# Patient Record
Sex: Male | Born: 1960 | Race: White | Hispanic: No | Marital: Single | Smoking: Current every day smoker
Health system: Southern US, Community
[De-identification: ages and names within clinical notes are randomized; demographics above are authoritative.]

## PROBLEM LIST (undated history)

## (undated) ENCOUNTER — Emergency Department (HOSPITAL_COMMUNITY): Admission: EM | Payer: Self-pay | Source: Home / Self Care

## (undated) DIAGNOSIS — I10 Essential (primary) hypertension: Secondary | ICD-10-CM

## (undated) DIAGNOSIS — F209 Schizophrenia, unspecified: Secondary | ICD-10-CM

## (undated) DIAGNOSIS — F319 Bipolar disorder, unspecified: Secondary | ICD-10-CM

## (undated) DIAGNOSIS — I2119 ST elevation (STEMI) myocardial infarction involving other coronary artery of inferior wall: Secondary | ICD-10-CM

## (undated) DIAGNOSIS — I2510 Atherosclerotic heart disease of native coronary artery without angina pectoris: Secondary | ICD-10-CM

## (undated) DIAGNOSIS — E785 Hyperlipidemia, unspecified: Secondary | ICD-10-CM

## (undated) HISTORY — DX: Schizophrenia, unspecified: F20.9

## (undated) HISTORY — DX: Essential (primary) hypertension: I10

## (undated) HISTORY — DX: ST elevation (STEMI) myocardial infarction involving other coronary artery of inferior wall: I21.19

## (undated) HISTORY — DX: Atherosclerotic heart disease of native coronary artery without angina pectoris: I25.10

## (undated) HISTORY — DX: Hyperlipidemia, unspecified: E78.5

## (undated) HISTORY — PX: COLONOSCOPY: SHX174

## (undated) HISTORY — PX: NASAL SINUS SURGERY: SHX719

---

## 2007-08-25 ENCOUNTER — Ambulatory Visit: Payer: Self-pay | Admitting: Cardiovascular Disease

## 2007-08-25 ENCOUNTER — Inpatient Hospital Stay (HOSPITAL_COMMUNITY): Admission: AD | Admit: 2007-08-25 | Discharge: 2007-08-27 | Payer: Self-pay | Admitting: Cardiology

## 2007-08-26 ENCOUNTER — Encounter: Payer: Self-pay | Admitting: Cardiovascular Disease

## 2007-10-20 ENCOUNTER — Ambulatory Visit: Payer: Self-pay | Admitting: Cardiology

## 2007-10-30 ENCOUNTER — Ambulatory Visit: Payer: Self-pay | Admitting: Cardiology

## 2008-01-29 ENCOUNTER — Ambulatory Visit: Payer: Self-pay | Admitting: Cardiology

## 2008-01-29 DIAGNOSIS — E785 Hyperlipidemia, unspecified: Secondary | ICD-10-CM | POA: Insufficient documentation

## 2008-01-29 DIAGNOSIS — I2510 Atherosclerotic heart disease of native coronary artery without angina pectoris: Secondary | ICD-10-CM | POA: Insufficient documentation

## 2010-05-16 ENCOUNTER — Emergency Department (HOSPITAL_COMMUNITY): Admission: EM | Admit: 2010-05-16 | Payer: Self-pay | Source: Home / Self Care

## 2010-05-24 NOTE — H&P (Signed)
NAMESANG, BLOUNT                ACCOUNT NO.:  [id number]   MEDICAL RECORD NO.:  [id number]          PATIENT TYPE:  INP   LOCATION:  2914                         FACILITY:  [facility]   PHYSICIAN:  Veverly Fells. Excell Seltzer, MD  DATE OF BIRTH:  [birth year]   DATE OF ADMISSION:  08/25/2007  DATE OF DISCHARGE:                              HISTORY & PHYSICAL   ADDENDUM   Concerning patient [name], work number [phone].   I dictated to admit the patient to Dr. Donato Schultz, that is actually  incorrect.  The patient would be admitted to Dr. Tonny Bollman with  [facility], and so the H&P will need to be routed to him for co-  signature.      Christell Faith, MD   Electronically Signed     ______________________________  Veverly Fells. Excell Seltzer, MD    NDL/MEDQ  D:  08/25/2007  T:  08/26/2007  Job:  [id number]

## 2010-05-24 NOTE — Assessment & Plan Note (Signed)
[facility]ALTHCARE                       [place] CARDIOLOGY OFFICE NOTE   KAYD, LAUNER                       MRN:          [id number]  DATE:01/29/2008                            DOB:          July 21, 1960    PRIMARY CARE PHYSICIAN:  Dierdre Forth, NP, with [facility] Department.   REASON FOR VISIT:  Cardiac followup.   HISTORY OF PRESENT ILLNESS:  Mr. Cragle comes in for a 5-month visit.  He states that he has done very well without any significant angina or  progressive shortness of breath.  He reports compliance with his  medications which he is receiving with assistance from the Health  Department.  He has continued Plavix and states that he has a year's  supply.  His blood pressure and heart rate look fairly good today.  He  is due for followup liver function and lipids, and is tolerating high-  dose Zocor at this time.   ALLERGIES:  No known drug allergies.   PRESENT MEDICATIONS:  1. Aspirin 81 mg p.o. daily.  2. Coreg 12.5 mg p.o. b.i.d.  3. Zocor 80 mg p.o. nightly.  4. Plavix 75 mg p.o. daily.  5. Accupril 10 mg p.o. daily.  6. Imdur 60 mg p.o. daily.  7. Thiothixene 5 mg p.o. nightly.  8. Citalopram 20 mg p.o. q.a.m.  9. Trazodone 50 mg p.o. nightly.  10.Benztropine mesylate 1 mg p.o. nightly.  11.Sublingual nitroglycerin 0.4 mg p.r.n.  12.Hydrocodone p.r.n.   REVIEW OF SYSTEMS:  As described in the history of present illness.  He  does state that he has cut back his cigarettes, but is still smoking one  pack per day.  We talked about smoking cessation today.  He is not  reporting any claudication symptoms.  He is no longer complaining of  cough.  Otherwise negative.   PHYSICAL EXAMINATION:  VITAL SIGNS:  Blood pressure is 127/80, heart  rate is 59 and regular, weight is 185 pounds.  GENERAL:  The patient is comfortable, no acute distress, somewhat  disheveled.  HEENT:  Conjunctiva is normal.  Oropharynx is  clear.  NECK:  Supple.  No elevated jugular venous pressure.  No loud bruits.  No thyromegaly.  LUNGS:  Clear with diminished breath sounds somewhat coarse.  No  wheezing.  CARDIAC:  Regular rate and rhythm.  No pericardial rub or S3 gallop.  ABDOMEN:  Soft, nontender.  Normoactive bowel sounds.  EXTREMITIES:  No significant pitting edema.  Distal pulses are 1 to 2+.  SKIN:  Warm and dry.  MUSCULOSKELETAL:  No kyphosis noted.  NEUROPSYCHIATRIC:  The patient is alert and oriented x3.  Affect is  appropriate.   IMPRESSION AND RECOMMENDATIONS:  Coronary artery disease status post  inferoposterior myocardial infarction in August 2009 related to an  occluded dominant circumflex vessel which was treated with bare-metal  stenting.  He has moderate residual left anterior descending and  diagonal disease.  This is being managed medically and he is not  reporting any angina on medical therapy.  I will plan to continue dual-  antiplatelet therapy at this point.  He is due for followup lipid  profile and liver function tests on high-dose Zocor.  We will aim for  LDL control around 70.  Otherwise, I will plan to see him back over the  next 6 months for reevaluation.      Jonelle Sidle, MD  Electronically Signed     Jonelle Sidle, MD  Electronically Signed   SGM/MedQ  DD: 01/29/2008  DT: 01/30/2008  Job #: [id number]   cc:   Dierdre Forth, NP

## 2010-05-24 NOTE — Discharge Summary (Signed)
[name], [name]                ACCOUNT NO.:  [id number]   MEDICAL RECORD NO.:  [id number]          PATIENT TYPE:  INP   LOCATION:  2927                         FACILITY:  [facility]   PHYSICIAN:  Marca Ancona, MD      DATE OF BIRTH:  August 27, 1960   DATE OF ADMISSION:  08/25/2007  DATE OF DISCHARGE:  08/27/2007                         DISCHARGE SUMMARY - REFERRING   Patient does not have primary care physician.   DISCHARGE DIAGNOSES:  1. Inferior ST elevated myocardial infarction.  2. Coronary artery disease.  3. Status post bare metal stent to the right coronary artery.  4. Hyperlipidemia.  5. Hypertension.  6. Hyperglycemia, however, probably related to #1 as hemoglobin A1c is      5.5.  7. Tobacco use.  8. Cocaine, marijuana, and alcohol use.  9. Elevated liver function tests, probable combination of myocardial      infarction and alcohol abuse.   SUMMARY OF HISTORY:  [name] is a 50 year old white male without  known cardiac history.  He experienced crushing substernal chest  discomfort while in a heated argument with his son.  On presentation,  EKG showed inferoposterior  ST elevated myocardial infarction.  He was  taken emergently to the catheterization lab.   HISTORY:  Notable for:  1. Tobacco use.  2. Alcohol use.  3. Cocaine use.  4. Marijuana use.  5. Hypertension.  6. History of bipolar schizophrenia.  7. Poor dentition.  8. Untreated inguinal hernia.   LABORATORY DATA:  Admission weight was 84 kg.  On August 26, 2007, H&H  was 14.9 and 44.4, normal indices, platelets 314, WBCs 15.0, sodium 138,  potassium 3.8, BUN 5, creatinine 1.06, glucose 162.  LFTs were elevated,  his AST and ALT were 491 and 127 respectively on August 27, 2007.  Prior  to discharge, they had decreased to 259 and 133.  Hemoglobin A1c was  5.5.  Initial CK-MB was 6196 and 356.1 with a relative index 5.7.  Troponin was greater than 100.  Subsequent CK was declining.  Troponin  remained  greater than 100.  BNP was 137.  Fasting lipid showed a total  cholesterol of 172, triglycerides 86, HDL 49, LDL 106.  TSH was slightly  elevated at 1.13.  EKGs post admission showed evolving inferior  myocardial infarction.   HOSPITAL COURSE:  [name] was transferred from [facility] for  further treatment of his myocardial infarction.  Taken emergently to the  catheterization lab and received a bare metal stent to the circumflex.  He was admitted to the CCU post procedure.  Case management assisted  with discharge needs.  PFTs were performed.  Findings were suggestive of  minimal small airway disease.  Final report is pending.  Blood pressure  was elevated.  Medications were adjusted.  Cardiac rehab assisted with  ambulation and education.  Tobacco cessation consult was performed.  Care coordinator also assisted with discharge needs.  Clinical social  work also saw the patient given his substance abuse issues.  He refused  resource manuals or information about outpatient services.  He did ask  for  medication assistance and financial counseling.   HISTORY:  As previously.   PROCEDURES PERFORMED:  Cardiac catheterization with bare metal stenting  to the circumflex by Dr. Tonny Bollman on August 25, 2007.   NEW MEDICATIONS:  For him include:  1. Aspirin 325 mg daily.  2. Coreg 12.5 mg b.i.d.  3. Lisinopril 10 mg daily.  4. Zocor 80 mg q.h.s.   He was advised these could be obtained at [facility] for 4 dollars.  He  was also given a prescription for Nicoderm patch 14 mg daily, apply and  remove daily, sublingual nitroglycerin as needed, Plavix 75 mg daily.  He did receive a prescription and a card for 2 weeks free of Plavix and  assistance filling out the Plavix assistance forms.  He was advised to  obtain a primary care physician.  No smoking, alcohol, or pot.  Bring  all medications to all appointments.  He will follow up with Dr. Excell Seltzer  in the [facility] office on  September 13, 2007, at 2:45.   ACTIVITIES:  Restricted in regard to lifting, driving, sexual activity,  or heavy exertion for 1 week.   WOUND CARE:  As per supplemental sheet postcatheterization.   He was asked to maintain a low-salt, heart-healthy diet.   DISCHARGE TIME:  45 minutes.      Joellyn Rued, PA-C      Marca Ancona, MD  Electronically Signed    EW/MEDQ  D:  08/27/2007  T:  08/27/2007  Job:  [id number]   cc:   Veverly Fells. Excell Seltzer, MD

## 2010-05-24 NOTE — Assessment & Plan Note (Signed)
[facility]                       [place] CARDIOLOGY OFFICE NOTE   BRYAM, TABORDA                       MRN:          [id number]  DATE:10/30/2007                            DOB:          [birth year]    PRIMARY CARE PHYSICIAN:  Isla Pence, NP, with the [facility] Department.   REASON FOR VISIT:  Cardiac followup.   HISTORY OF PRESENT ILLNESS:  Mr. [name] is a 50 year old male admitted  to [facility] back in August with evidence of an  inferoposterior myocardial infarction.  He was taken urgently to the  cardiac catheterization lab and was found to have an occluded dominant  circumflex with moderate residual disease in the left anterior  descending and diagonal branches.  Overall, left ventricular systolic  function was preserved with segmental inferoposterior wall motion  abnormality.  He underwent placement of a bare-metal stent in the  circumflex with good results.  He was due to see Dr. Excell Seltzer back in the  office in September, although missed that visit.  He presents today in  the [place] office to establish care.  He tells me that he has been  following with the Health Department and has assistance for his Plavix.  He reports compliance with his medications.  He states that he is no  longer abusing substances, which previously included cocaine, marijuana,  and alcohol.  He also reports receiving care for his schizophrenia.  He  is not having any chest pain.  He does complain of mid back discomfort  suspicious for disk disease with some tenderness in the area to  palpation.  His electrocardiogram today shows sinus rhythm with leftward  axis suggestive of previous inferior infarct, although otherwise  nonspecific T-wave changes and single premature ventricular complex are  noted.   ALLERGIES:  No known drug allergies.   MEDICATIONS:  1. Aspirin as 325 mg p.o. daily.  2. Coreg 12.5 mg p.o. daily.  3.  Zocor 8 mg p.o. q.h.s.  4. Plavix 75 p.o. daily.  5. Accupril 10 mg p.o. daily.  6. Imdur 60 mg p.o. daily.  7. Thiothixene 5 mg p.o. q.h.s.  8. Citalopram 20 mg p.o. daily.  9. Trazodone 50 mg p.o. q.h.s.  10.Sublingual nitroglycerin p.r.n.  11.Hydrocodone p.r.n.Marland Kitchen   REVIEW OF SYSTEMS:  As per history of present illness.  He has had a dry  cough over the last week or so.  This has been nonproductive.  He has  had no fevers or chills.   PHYSICAL EXAMINATION:  VITAL SIGNS:  Blood pressure is under good  control at 110/66, heart rate is 68, and weight is 182 pounds.  GENERAL:  This is a bearded male, in no acute distress.  HEENT:  Conjunctiva is normal.  Oropharynx is clear.  NECK:  Supple.  No loud carotid bruits.  LUNGS:  Clear with diminished breath sounds.  CARDIAC:  Regular rate and rhythm.  No S3 gallop or pericardial rub.  EXTREMITIES:  No significant pitting edema.   IMPRESSION AND RECOMMENDATIONS:  1. Cardiovascular disease, status post presentation with an  inferoposterior myocardial infarction back in August due to an      occluded dominant circumflex, which was treated with a bare-metal      stent.  He does have moderate residual disease in the left anterior      descending and diagonal that is being managed medically.  He is      symptomatically stable at this point and his left ventricular      ejection fraction by echocardiography was 50% with the mid-to-basal      inferoposterior akinesis.  I plan to continue medical therapy.  I      have asked him to keep an eye on progression in his cough.  It may      be that this is related to his Accupril, which is new medication.      Otherwise, I would like to see him back over the next 3 months.  2. He will need followup lipids around the time of his next visit.  He      seems to be tolerating high-dose simvastatin at this time.  LDL was      106 at baseline.     Jonelle Sidle, MD  Electronically Signed     SGM/MedQ  DD: 10/30/2007  DT: 10/31/2007  Job #: [id number]   cc:   Isla Pence, NP

## 2010-05-24 NOTE — H&P (Signed)
[name], [name]                ACCOUNT NO.:  [id number]   MEDICAL RECORD NO.:  [id number]          PATIENT TYPE:  INP   LOCATION:  2914                         FACILITY:  [facility]   PHYSICIAN:  Christell Faith, MD   DATE OF BIRTH:  03-15-1960   DATE OF ADMISSION:  08/25/2007  DATE OF DISCHARGE:                              HISTORY & PHYSICAL   CHIEF COMPLAINT:  Chest pain.   HISTORY OF PRESENT ILLNESS:  This is a 50 year old white man with no  known cardiac history who experienced severe substernal chest pressure  today while in a heated argument with his son.  At the outside hospital,  his EKG showed inferior posterior ST elevation with reciprocal changes  and the patient received heparin, aspirin, and 300 mg of Plavix and was  transferred to [facility].  At [facility], he was taken emergently to  the Cath Lab by Dr. Excell Seltzer, where he was found to have a high-grade  dominant left circumflex lesion which was successfully treated with a  bare-metal stent.  The patient's pain resolved immediately.  He does  admit to intranasal cocaine use within the past 2 weeks.   PAST MEDICAL HISTORY:  1. Inguinal hernias.  2. Frequent abscessed teeth  3. Recent acceleratingangina.  4. Hypertension.  5. Bipolar disorder and schizophrenia.   SOCIAL HISTORY:  He lives in [place].  He is a Scientist, water quality.  He smokes 2-3  packs of cigarettes a day, uses marijuana regularly, and intranasal  cocaine frequently.   FAMILY HISTORY:  Father has suffered myocardial infarction and stroke  and the patient has a brother who has suffered a myocardial infarction.   ALLERGIES:  None.   MEDICATIONS:  None.  He was previously placed on medicines for  schizophrenia, but he says that he could not afford them and stopped  them on his own.   REVIEW OF SYSTEMS:  Positive for recent angina.  Positive for  toothaches.  Complete review of systems was unable to be obtained  secondary to the acute nature of the  illness.   PHYSICAL EXAMINATION:  VITAL SIGNS:  Weight 95 kg, pulse 90, respiratory  rate 16, blood pressure 149/102, and saturation 99% on room air.  GENERAL:  This is a disheveled white man in mild distress.  He is  pleasant.  HEENT:  His dentition is poor.  Pupils are equal, round, and reactive.  Sclerae clear.  Mucous membranes are moist.  NECK:  Supple.  Neck veins are flat.  No carotid bruits.  LUNGS:  Clear to auscultation bilaterally without wheezing or rales.  CARDIAC:  Normal rate, regular rhythm.  No murmurs or gallops.  Point of  maximal impulse is normal.  ABDOMEN:  Soft, nontender, and nondistended.  EXTREMITIES:  No edema.  2+ femoral pulses bilaterally, 2+ dorsalis  pedis and radial pulses bilaterally.  SKIN:  No rash or lesion.  MUSCULOSKELETAL:  No acute joint deformity or effusions.  PSYCHIATRIC:  Pleasant man who is oriented to person, place, and time  and does not appear overly agitated or actively psychotic.  NEUROLOGIC:  Awake, alert, and oriented x3.  Facial expressions are  symmetric and normal.  5/5 strength in all 4 extremities.   DIAGNOSTIC TESTS:  EKG shows sinus rhythm with a rate of 72 beats per  minute.  There are posterior and inferior ST elevations with reciprocal  changes.   LABORATORY DATA:  White blood cells 15.9, hemoglobin 15.8, and platelets  335.  Sodium 133, potassium 3.3, BUN 6, creatinine 1.1, glucose 137, and  calcium 8.5.   IMPRESSION:  A 50 year old white man with tobacco and cocaine abuse  problems who presents with an inferior and posterior ST-elevated  myocardial infarction.   PLAN:  Emergent cath by Dr. Excell Seltzer revealed high-grade circumflex lesion  treated with a bare-metal stent.  Cath lab medicines included Angiomax  and Plavix and he will initiate aspirin 325 mg a day and Plavix 75 mg a  day.  We will start him on low-dose beta blocker and empirically start a  statin.  We will check fasting lipid panel.  We will check   echocardiogram for LV function which appeared low normal on LV gram by  my interpretation.  He will need tobacco and substance abuse counseling  and cardiac rehab.  In addition, we will try to set him up with a  primary care doctor and hopefully he can get on some psychiatric  medicines that he can afford.      Christell Faith, MD  Electronically Signed     NDL/MEDQ  D:  08/25/2007  T:  08/26/2007  Job:  [id number]

## 2010-12-28 ENCOUNTER — Encounter: Payer: Self-pay | Admitting: Cardiology

## 2010-12-28 DIAGNOSIS — I10 Essential (primary) hypertension: Secondary | ICD-10-CM | POA: Insufficient documentation

## 2014-02-12 ENCOUNTER — Ambulatory Visit (HOSPITAL_COMMUNITY)
Admission: RE | Admit: 2014-02-12 | Discharge: 2014-02-12 | Disposition: A | Discharge status: Home / Self Care | Payer: Disability Insurance | Source: Ambulatory Visit | Attending: Family Medicine | Admitting: Family Medicine

## 2014-02-12 ENCOUNTER — Other Ambulatory Visit (HOSPITAL_COMMUNITY): Payer: Self-pay | Admitting: Family Medicine

## 2014-02-12 DIAGNOSIS — I639 Cerebral infarction, unspecified: Secondary | ICD-10-CM

## 2014-02-12 DIAGNOSIS — R61 Generalized hyperhidrosis: Secondary | ICD-10-CM | POA: Insufficient documentation

## 2014-02-12 DIAGNOSIS — R079 Chest pain, unspecified: Secondary | ICD-10-CM | POA: Diagnosis not present

## 2014-06-17 ENCOUNTER — Encounter (HOSPITAL_COMMUNITY): Payer: Self-pay | Admitting: Emergency Medicine

## 2014-06-17 ENCOUNTER — Emergency Department (HOSPITAL_COMMUNITY)
Admission: EM | Admit: 2014-06-17 | Discharge: 2014-06-18 | Disposition: A | Discharge status: Home / Self Care | Payer: Medicaid Other | Attending: Emergency Medicine | Admitting: Emergency Medicine

## 2014-06-17 ENCOUNTER — Emergency Department (HOSPITAL_COMMUNITY): Payer: Medicaid Other

## 2014-06-17 DIAGNOSIS — Z720 Tobacco use: Secondary | ICD-10-CM | POA: Insufficient documentation

## 2014-06-17 DIAGNOSIS — I10 Essential (primary) hypertension: Secondary | ICD-10-CM | POA: Insufficient documentation

## 2014-06-17 DIAGNOSIS — Y9289 Other specified places as the place of occurrence of the external cause: Secondary | ICD-10-CM | POA: Insufficient documentation

## 2014-06-17 DIAGNOSIS — Y9389 Activity, other specified: Secondary | ICD-10-CM | POA: Insufficient documentation

## 2014-06-17 DIAGNOSIS — Z8719 Personal history of other diseases of the digestive system: Secondary | ICD-10-CM | POA: Diagnosis not present

## 2014-06-17 DIAGNOSIS — I252 Old myocardial infarction: Secondary | ICD-10-CM | POA: Insufficient documentation

## 2014-06-17 DIAGNOSIS — Z8659 Personal history of other mental and behavioral disorders: Secondary | ICD-10-CM | POA: Diagnosis not present

## 2014-06-17 DIAGNOSIS — S46912A Strain of unspecified muscle, fascia and tendon at shoulder and upper arm level, left arm, initial encounter: Secondary | ICD-10-CM | POA: Insufficient documentation

## 2014-06-17 DIAGNOSIS — I2510 Atherosclerotic heart disease of native coronary artery without angina pectoris: Secondary | ICD-10-CM | POA: Diagnosis not present

## 2014-06-17 DIAGNOSIS — W201XXA Struck by object due to collapse of building, initial encounter: Secondary | ICD-10-CM | POA: Insufficient documentation

## 2014-06-17 DIAGNOSIS — S4992XA Unspecified injury of left shoulder and upper arm, initial encounter: Secondary | ICD-10-CM | POA: Diagnosis present

## 2014-06-17 DIAGNOSIS — Y998 Other external cause status: Secondary | ICD-10-CM | POA: Diagnosis not present

## 2014-06-17 DIAGNOSIS — Z8639 Personal history of other endocrine, nutritional and metabolic disease: Secondary | ICD-10-CM | POA: Insufficient documentation

## 2014-06-17 MED ORDER — IBUPROFEN 800 MG PO TABS
800.0000 mg | ORAL_TABLET | Freq: Once | ORAL | Status: AC
  Administered 2014-06-18: 800 mg via ORAL
  Filled 2014-06-17: qty 1

## 2014-06-17 NOTE — ED Notes (Signed)
Pt dropped a transmission on his left arm around 8 pm today. Now numbness, tingling in finger, shoulder pain, pt could not sleep due to pain

## 2014-06-17 NOTE — ED Notes (Signed)
Pt reporting pain in left arm.  Abrasion noted to left elbow. ROM limited due to pain.

## 2014-06-17 NOTE — ED Provider Notes (Signed)
CSN: [id number]     Arrival date & time 06/17/14  2256 History  This chart was scribed for Glynn Octave, MD by Evon Slack, ED Scribe. This patient was seen in room APA19/APA19 and the patient's care was started at 11:15 PM.    Chief Complaint  Patient presents with  . Arm Injury    The history is provided by the patient. No language interpreter was used.   HPI Comments: [name] is a 54 y.o. male who presents to the Emergency Department complaining of new arm injury onset tonight at 7:30 PM. Pt states that he was removing a transmission from a truck and the transmission fell onto his left arm. Pt states that the transmission caused his arm to rotate an awkward way back wards and is now complaining of left shoulder pain and presents with small abrasion to the left elbow. Pt states that he has some slight numbness in his left hand. Pt denies any medications PTA. Pt states that the pain is worse when lifting his arm above his head. Denies head injury or LOC. Pt states that he is on aspirin. Pt denies SOB or CP.    Past Medical History  Diagnosis Date  . CAD (coronary artery disease)     Inferoposterior 8/09 s/p BMS circumflex, LVEF 50%  . Hyperlipidemia   . Hypertension   . Myocardial infarction   . Inguinal hernia   . Schizophrenia    Past Surgical History  Procedure Laterality Date  . Nasal sinus surgery    . Carotid stent     Family History  Problem Relation Age of Onset  . Heart attack Father   . Heart attack Brother    History  Substance Use Topics  . Smoking status: Current Every Day Smoker -- 1.00 packs/day    Types: Cigarettes  . Smokeless tobacco: Not on file  . Alcohol Use: No    Review of Systems  Respiratory: Negative for shortness of breath.   Cardiovascular: Negative for chest pain.  Musculoskeletal: Positive for arthralgias.  Neurological: Positive for numbness.  All other systems reviewed and are negative.    Allergies  Review of patient's  allergies indicates not on file.  Home Medications   Prior to Admission medications   Medication Sig Start Date End Date Taking? Authorizing Provider  ibuprofen (ADVIL,MOTRIN) 800 MG tablet Take 1 tablet (800 mg total) by mouth 3 (three) times daily. 06/18/14   Glynn Octave, MD   BP 159/104 mmHg  Pulse 82  Temp(Src) 97.7 F (36.5 C) (Oral)  Resp 18  Ht  (1.778 m)  Wt 213 lb (96.616 kg)  BMI 30.56 kg/m2  SpO2 97%   Physical Exam  Constitutional: He is oriented to person, place, and time. He appears well-developed and well-nourished. No distress.  HENT:  Head: Normocephalic and atraumatic.  Mouth/Throat: Oropharynx is clear and moist. No oropharyngeal exudate.  Eyes: Conjunctivae and EOM are normal. Pupils are equal, round, and reactive to light.  Neck: Normal range of motion. Neck supple.  No C-spine tenderness.   Cardiovascular: Normal rate, regular rhythm, normal heart sounds and intact distal pulses.   No murmur heard. Pulmonary/Chest: Effort normal and breath sounds normal. No respiratory distress.  Abdominal: Soft. There is no tenderness. There is no rebound and no guarding.  Musculoskeletal: Normal range of motion. He exhibits tenderness. He exhibits no edema.  Abrasion to left elbow. Tender to left lateral anterior left shoulder. Able to raise arm above head. Equal grips  strength, intact radial pulses. Full ROM of hand, wrist and elbow. Reduced ROM of left shoulder due to pain.   Neurological: He is alert and oriented to person, place, and time. No cranial nerve deficit. He exhibits normal muscle tone. Coordination normal.  No ataxia on finger to nose bilaterally. No pronator drift. 5/5 strength throughout. CN 2-12 intact. Negative Romberg. Equal grip strength. Sensation intact. Gait is normal.   Skin: Skin is warm.  Psychiatric: He has a normal mood and affect. His behavior is normal.  Nursing note and vitals reviewed.   ED Course  Procedures (including critical  care time) DIAGNOSTIC STUDIES: Oxygen Saturation is 97% on RA, normal by my interpretation.    COORDINATION OF CARE: 11:45 PM-Discussed treatment plan with pt at bedside and pt agreed to plan.  12:44 AM- rechecked with Pt and discussed that x-ray results.   Labs Review Labs Reviewed - No data to display  Imaging Review Dg Elbow Complete Left  06/18/2014   CLINICAL DATA:  Heavy object fell on arm tonight.  Pain.  EXAM: LEFT ELBOW - COMPLETE 3+ VIEW  COMPARISON:  None.  FINDINGS: There is no evidence of fracture, dislocation, or joint effusion. There is no evidence of arthropathy or other focal bone abnormality. Soft tissues are unremarkable.  IMPRESSION: Negative.   Electronically Signed   By: Davonna BellingJohn  Curnes M.D.   On: 06/18/2014 00:33   Dg Shoulder Left  06/18/2014   CLINICAL DATA:  Heavy object fell on arm.  Pain.  EXAM: LEFT SHOULDER - 2+ VIEW  COMPARISON:  None.  FINDINGS: There is no evidence of fracture or dislocation. There is no evidence of arthropathy or other focal bone abnormality. Soft tissues are unremarkable.  IMPRESSION: Negative.   Electronically Signed   By: Davonna BellingJohn  Curnes M.D.   On: 06/18/2014 00:33     EKG Interpretation None      MDM   Final diagnoses:  Shoulder strain, left, initial encounter   Pain to left shoulder and elbow after dropping car transmission on elbow earlier today. Arm was forced backwards with hyperextension of shoulder. Pain mostly in lateral and anterior shoulder. Intact radial pulse. Intact cardinal hand movements and grip strength. No chest pain or shortness of breath. No neck pain.  Xrays negative. Patient is neurovascularly intact. Equal grip strength bilaterally. Pain with range of motion of shoulder. Discussed with patient cannot rule out rotator cuff injury. We'll treat conservatively with anti-inflammatory sling for comfort. Follow-up with orthopedics. Return precautions discussed.   I personally performed the services described in this  documentation, which was scribed in my presence. The recorded information has been reviewed and is accurate.    Glynn OctaveStephen Welby Montminy, MD 06/18/14 [phone]

## 2014-06-18 MED ORDER — IBUPROFEN 800 MG PO TABS: 800.0000 mg | ORAL_TABLET | Freq: Three times a day (TID) | ORAL | Status: DC

## 2014-06-18 NOTE — Discharge Instructions (Signed)
Muscle Strain Followup with the orthopedic doctor. Return to the ED if you develop new or worsening symptoms. A muscle strain is an injury that occurs when a muscle is stretched beyond its normal length. Usually a small number of muscle fibers are torn when this happens. Muscle strain is rated in degrees. First-degree strains have the least amount of muscle fiber tearing and pain. Second-degree and third-degree strains have increasingly more tearing and pain.  Usually, recovery from muscle strain takes 1-2 weeks. Complete healing takes 5-6 weeks.  CAUSES  Muscle strain happens when a sudden, violent force placed on a muscle stretches it too far. This may occur with lifting, sports, or a fall.  RISK FACTORS Muscle strain is especially common in athletes.  SIGNS AND SYMPTOMS At the site of the muscle strain, there may be:  Pain.  Bruising.  Swelling.  Difficulty using the muscle due to pain or lack of normal function. DIAGNOSIS  Your health care provider will perform a physical exam and ask about your medical history. TREATMENT  Often, the best treatment for a muscle strain is resting, icing, and applying cold compresses to the injured area.  HOME CARE INSTRUCTIONS   Use the PRICE method of treatment to promote muscle healing during the first 2-3 days after your injury. The PRICE method involves:  Protecting the muscle from being injured again.  Restricting your activity and resting the injured body part.  Icing your injury. To do this, put ice in a plastic bag. Place a towel between your skin and the bag. Then, apply the ice and leave it on from 15-20 minutes each hour. After the third day, switch to moist heat packs.  Apply compression to the injured area with a splint or elastic bandage. Be careful not to wrap it too tightly. This may interfere with blood circulation or increase swelling.  Elevate the injured body part above the level of your heart as often as you can.  Only  take over-the-counter or prescription medicines for pain, discomfort, or fever as directed by your health care provider.  Warming up prior to exercise helps to prevent future muscle strains. SEEK MEDICAL CARE IF:   You have increasing pain or swelling in the injured area.  You have numbness, tingling, or a significant loss of strength in the injured area. MAKE SURE YOU:   Understand these instructions.  Will watch your condition.  Will get help right away if you are not doing well or get worse. Document Released: 12/26/2004 Document Revised: 10/16/2012 Document Reviewed: 07/25/2012 [facility] Patient Information 2015 [place], [place]. This information is not intended to replace advice given to you by your health care provider. Make sure you discuss any questions you have with your health care provider.

## 2015-07-28 ENCOUNTER — Inpatient Hospital Stay (HOSPITAL_COMMUNITY)
Admission: EM | Admit: 2015-07-28 | Discharge: 2015-07-31 | DRG: 603 | Disposition: A | Discharge status: Home / Self Care | Payer: Medicaid Other | Attending: Internal Medicine | Admitting: Internal Medicine

## 2015-07-28 ENCOUNTER — Emergency Department (HOSPITAL_COMMUNITY): Payer: Medicaid Other

## 2015-07-28 ENCOUNTER — Encounter (HOSPITAL_COMMUNITY): Payer: Self-pay | Admitting: Emergency Medicine

## 2015-07-28 DIAGNOSIS — I252 Old myocardial infarction: Secondary | ICD-10-CM

## 2015-07-28 DIAGNOSIS — E785 Hyperlipidemia, unspecified: Secondary | ICD-10-CM | POA: Diagnosis present

## 2015-07-28 DIAGNOSIS — F319 Bipolar disorder, unspecified: Secondary | ICD-10-CM | POA: Diagnosis present

## 2015-07-28 DIAGNOSIS — L0390 Cellulitis, unspecified: Secondary | ICD-10-CM | POA: Diagnosis present

## 2015-07-28 DIAGNOSIS — Z7982 Long term (current) use of aspirin: Secondary | ICD-10-CM | POA: Diagnosis not present

## 2015-07-28 DIAGNOSIS — F209 Schizophrenia, unspecified: Secondary | ICD-10-CM | POA: Diagnosis present

## 2015-07-28 DIAGNOSIS — I10 Essential (primary) hypertension: Secondary | ICD-10-CM | POA: Diagnosis present

## 2015-07-28 DIAGNOSIS — Z8249 Family history of ischemic heart disease and other diseases of the circulatory system: Secondary | ICD-10-CM | POA: Diagnosis not present

## 2015-07-28 DIAGNOSIS — F17210 Nicotine dependence, cigarettes, uncomplicated: Secondary | ICD-10-CM | POA: Diagnosis present

## 2015-07-28 DIAGNOSIS — L0291 Cutaneous abscess, unspecified: Secondary | ICD-10-CM

## 2015-07-28 DIAGNOSIS — L03114 Cellulitis of left upper limb: Principal | ICD-10-CM

## 2015-07-28 DIAGNOSIS — I2510 Atherosclerotic heart disease of native coronary artery without angina pectoris: Secondary | ICD-10-CM | POA: Diagnosis present

## 2015-07-28 HISTORY — DX: Bipolar disorder, unspecified: F31.9

## 2015-07-28 LAB — BASIC METABOLIC PANEL
Anion gap: 9 (ref 5–15)
BUN: 11 mg/dL (ref 6–20)
CHLORIDE: 100 mmol/L — AB (ref 101–111)
CO2: 26 mmol/L (ref 22–32)
CREATININE: 1 mg/dL (ref 0.61–1.24)
Calcium: 8.9 mg/dL (ref 8.9–10.3)
GFR calc non Af Amer: >60 mL/min (ref >60)
Glucose, Bld: 108 mg/dL — ABNORMAL HIGH (ref 65–99)
POTASSIUM: 3.4 mmol/L — AB (ref 3.5–5.1)
Sodium: 135 mmol/L (ref 135–145)

## 2015-07-28 LAB — CBC WITH DIFFERENTIAL/PLATELET
BASOS PCT: 0 %
Basophils Absolute: 0.1 10*3/uL (ref 0.0–0.1)
Eosinophils Absolute: 0.2 10*3/uL (ref 0.0–0.7)
Eosinophils Relative: 1 %
HCT: 46.4 % (ref 39.0–52.0)
HEMOGLOBIN: 16.2 g/dL (ref 13.0–17.0)
Lymphocytes Relative: 12 %
Lymphs Abs: 2 10*3/uL (ref 0.7–4.0)
MCH: 32 pg (ref 26.0–34.0)
MCHC: 34.9 g/dL (ref 30.0–36.0)
MCV: 91.5 fL (ref 78.0–100.0)
MONOS PCT: 7 %
Monocytes Absolute: 1.2 10*3/uL — ABNORMAL HIGH (ref 0.1–1.0)
NEUTROS ABS: 13.4 10*3/uL — AB (ref 1.7–7.7)
Neutrophils Relative %: 80 %
Platelets: 322 10*3/uL (ref 150–400)
RBC: 5.07 MIL/uL (ref 4.22–5.81)
RDW: 12.9 % (ref 11.5–15.5)
WBC: 16.8 10*3/uL — AB (ref 4.0–10.5)

## 2015-07-28 MED ORDER — ASPIRIN 81 MG PO CHEW
81.0000 mg | CHEWABLE_TABLET | Freq: Every day | ORAL | Status: DC
  Administered 2015-07-28 – 2015-07-31 (×4): 81 mg via ORAL
  Filled 2015-07-28 (×7): qty 1

## 2015-07-28 MED ORDER — ACETAMINOPHEN 325 MG PO TABS
650.0000 mg | ORAL_TABLET | Freq: Four times a day (QID) | ORAL | Status: DC | PRN
  Administered 2015-07-28: 650 mg via ORAL
  Filled 2015-07-28: qty 2

## 2015-07-28 MED ORDER — HEPARIN SODIUM (PORCINE) 5000 UNIT/ML IJ SOLN
5000.0000 [IU] | Freq: Three times a day (TID) | INTRAMUSCULAR | Status: DC
  Administered 2015-07-28 – 2015-07-31 (×8): 5000 [IU] via SUBCUTANEOUS
  Filled 2015-07-28 (×9): qty 1

## 2015-07-28 MED ORDER — ISOSORBIDE MONONITRATE ER 60 MG PO TB24
60.0000 mg | ORAL_TABLET | Freq: Every day | ORAL | Status: DC
  Administered 2015-07-28: 60 mg via ORAL
  Filled 2015-07-28 (×2): qty 1

## 2015-07-28 MED ORDER — CARVEDILOL 12.5 MG PO TABS
25.0000 mg | ORAL_TABLET | Freq: Three times a day (TID) | ORAL | Status: DC
  Administered 2015-07-28 (×2): 25 mg via ORAL
  Filled 2015-07-28: qty 2
  Filled 2015-07-28: qty 8
  Filled 2015-07-28: qty 2

## 2015-07-28 MED ORDER — MORPHINE SULFATE (PF) 4 MG/ML IV SOLN
4.0000 mg | Freq: Once | INTRAVENOUS | Status: AC
  Administered 2015-07-28: 4 mg via INTRAVENOUS
  Filled 2015-07-28: qty 1

## 2015-07-28 MED ORDER — LISINOPRIL 10 MG PO TABS
20.0000 mg | ORAL_TABLET | Freq: Every day | ORAL | Status: DC
  Administered 2015-07-28: 20 mg via ORAL
  Filled 2015-07-28 (×2): qty 2

## 2015-07-28 MED ORDER — ALBUTEROL SULFATE (2.5 MG/3ML) 0.083% IN NEBU
2.5000 mg | INHALATION_SOLUTION | Freq: Four times a day (QID) | RESPIRATORY_TRACT | Status: DC | PRN
Start: 2015-07-28 — End: 2015-07-31

## 2015-07-28 MED ORDER — PRAVASTATIN SODIUM 10 MG PO TABS
20.0000 mg | ORAL_TABLET | Freq: Every day | ORAL | Status: DC
  Administered 2015-07-28 – 2015-07-30 (×3): 20 mg via ORAL
  Filled 2015-07-28: qty 2
  Filled 2015-07-28 (×2): qty 1
  Filled 2015-07-28 (×2): qty 2

## 2015-07-28 MED ORDER — VANCOMYCIN HCL IN DEXTROSE 1-5 GM/200ML-% IV SOLN
1000.0000 mg | Freq: Once | INTRAVENOUS | Status: AC
  Administered 2015-07-28: 1000 mg via INTRAVENOUS
  Filled 2015-07-28: qty 200

## 2015-07-28 MED ORDER — CEFAZOLIN IN D5W 1 GM/50ML IV SOLN
1.0000 g | Freq: Three times a day (TID) | INTRAVENOUS | Status: DC
  Administered 2015-07-28 – 2015-07-31 (×9): 1 g via INTRAVENOUS
  Filled 2015-07-28 (×16): qty 50

## 2015-07-28 MED ORDER — DEXTROSE 5 % IV SOLN: 1.0000 g | INTRAVENOUS | Status: DC

## 2015-07-28 MED ORDER — ALBUTEROL SULFATE HFA 108 (90 BASE) MCG/ACT IN AERS: 1.0000 | INHALATION_SPRAY | Freq: Four times a day (QID) | RESPIRATORY_TRACT | Status: DC | PRN

## 2015-07-28 MED ORDER — ACETAMINOPHEN 650 MG RE SUPP: 650.0000 mg | Freq: Four times a day (QID) | RECTAL | Status: DC | PRN

## 2015-07-28 NOTE — ED Notes (Signed)
Patient complaining of left arm pain and swelling since July 7. [facility] infection to left arm. Swelling noted to left arm at triage. States he is still taking antibiotics that were given to him on Sunday.

## 2015-07-28 NOTE — H&P (Signed)
History and Physical    [name]:[id number] DOB: 1960-12-13 DOA: 07/28/2015  PCP: [facility]  Patient coming from:   Chief Complaint: Cellulitis  HPI: [name] is a 55 y.o. male with medical history significant of CAD, hypertension, hyperlipidemia who presents with worsening left arm cellulitis. Patient was recently seen at [facility]. Patient had recently been taking doxycycline as well as Levaquin with continued worsening. Patient subsequently presented to the emergency department for further workup.  ED Course: In emergency department, patient was noted have a leukocytosis with WBC of 16.8. Patient was given 1 dose of vancomycin. X-ray of the elbow demonstrated marked soft tissue swelling throughout the distal upper arm and proximal forearm.    Review of Systems:  Review of Systems  Constitutional: Negative for fever, chills and weight loss.  HENT: Negative for hearing loss and tinnitus.   Eyes: Negative for photophobia and discharge.  Respiratory: Negative for shortness of breath and wheezing.   Cardiovascular: Negative for claudication and leg swelling.  Gastrointestinal: Negative for nausea and vomiting.  Genitourinary: Negative for frequency and flank pain.  Musculoskeletal: Negative for joint pain and falls.  Skin:       Worsening patch of redness down left arm  Neurological: Negative for tingling, tremors and loss of consciousness.  Psychiatric/Behavioral: Negative for hallucinations and memory loss.      Past Medical History  Diagnosis Date  . CAD (coronary artery disease)     Inferoposterior 8/09 s/p BMS circumflex, LVEF 50%  . Hyperlipidemia   . Hypertension   . Myocardial infarction (HCC)   . Inguinal hernia   . Schizophrenia (HCC)   . Bipolar 1 disorder [facility])     Past Surgical History  Procedure Laterality Date  . Nasal sinus surgery    . Carotid stent    . Colonoscopy       reports that he has been smoking  Cigarettes.  He has been smoking about 1.00 pack per day. He does not have any smokeless tobacco history on file. He reports that he drinks alcohol. He reports that he does not use illicit drugs.  No Known Allergies  Family History  Problem Relation Age of Onset  . Heart attack Father   . Heart attack Brother     Prior to Admission medications   Medication Sig Start Date End Date Taking? Authorizing Provider  albuterol (PROVENTIL HFA;VENTOLIN HFA) 108 (90 Base) MCG/ACT inhaler Inhale 1 puff into the lungs every 6 (six) hours as needed for wheezing or shortness of breath.   Yes Historical Provider, MD  aspirin 325 MG tablet Take 325 mg by mouth daily.   Yes Historical Provider, MD  aspirin 81 MG tablet Take 81 mg by mouth daily.   Yes Historical Provider, MD  carvedilol (COREG) 25 MG tablet Take 25 mg by mouth 3 (three) times daily.   Yes Historical Provider, MD  doxycycline (MONODOX) 100 MG capsule Take 100 mg by mouth 2 (two) times daily.   Yes Historical Provider, MD  isosorbide mononitrate (IMDUR) 60 MG 24 hr tablet Take 60 mg by mouth daily.   Yes Historical Provider, MD  levofloxacin (LEVAQUIN) 750 MG tablet Take 750 mg by mouth daily.   Yes Historical Provider, MD  lisinopril (PRINIVIL,ZESTRIL) 20 MG tablet Take 20 mg by mouth daily.   Yes Historical Provider, MD  lovastatin (MEVACOR) 20 MG tablet Take 20 mg by mouth at bedtime.   Yes Historical Provider, MD  ondansetron [facility]) 4  MG tablet Take 4 mg by mouth every 8 (eight) hours as needed for nausea or vomiting.   Yes Historical Provider, MD  ibuprofen (ADVIL,MOTRIN) 800 MG tablet Take 1 tablet (800 mg total) by mouth 3 (three) times daily. Patient not taking: Reported on 07/28/2015 06/18/14   Glynn OctaveStephen Rancour, MD    Physical Exam: Filed Vitals:   07/28/15 0914 07/28/15 1214  BP: 148/78 144/81  Pulse: 102 92  Temp: 98.3 F (36.8 C)   TempSrc: Oral   Resp: 20 18  Height: 5\' 10"  (1.778 m)   Weight: 90.719 kg (200 lb)   SpO2:  99% 98%      Constitutional: NAD, calm, comfortable Filed Vitals:   07/28/15 0914 07/28/15 1214  BP: 148/78 144/81  Pulse: 102 92  Temp: 98.3 F (36.8 C)   TempSrc: Oral   Resp: 20 18  Height: 5\' 10"  (1.778 m)   Weight: 90.719 kg (200 lb)   SpO2: 99% 98%   Eyes: PERRL, lids and conjunctivae normal ENMT: Mucous membranes are moist. Posterior pharynx clear of any exudate or lesions.Normal dentition.  Neck: normal, supple, no masses, no thyromegaly Respiratory: clear to auscultation bilaterally, no wheezing, no crackles. Normal respiratory effort. No accessory muscle use.  Cardiovascular: Regular rate and rhythm, Abdomen: no tenderness, no masses palpated. No hepatosplenomegaly. Bowel sounds positive.  Musculoskeletal: no clubbing / cyanosis. No joint deformity upper and lower extremities. Good ROM, no contractures. Normal muscle tone.  Skin: Patch of erythema over her left arm extending from just under her armpit down to dorsum of left hand, no drainage Neurologic: CN 2-12 grossly intact. Sensation intact, DTR normal. Strength 5/5 in all 4.  Psychiatric: Normal judgment and insight. Alert and oriented x 3. Normal mood.    Labs on Admission: I have personally reviewed following labs and imaging studies  CBC:  Recent Labs Lab 07/28/15 0930  WBC 16.8*  NEUTROABS 13.4*  HGB 16.2  HCT 46.4  MCV 91.5  PLT 322   Basic Metabolic Panel:  Recent Labs Lab 07/28/15 0930  NA 135  K 3.4*  CL 100*  CO2 26  GLUCOSE 108*  BUN 11  CREATININE 1.00  CALCIUM 8.9   GFR: Estimated Creatinine Clearance: 94.6 mL/min (by C-G formula based on Cr of 1). Liver Function Tests: No results for input(s): AST, ALT, ALKPHOS, BILITOT, PROT, ALBUMIN in the last 168 hours. No results for input(s): LIPASE, AMYLASE in the last 168 hours. No results for input(s): AMMONIA in the last 168 hours. Coagulation Profile: No results for input(s): INR, PROTIME in the last 168 hours. Cardiac  Enzymes: No results for input(s): CKTOTAL, CKMB, CKMBINDEX, TROPONINI in the last 168 hours. BNP (last 3 results) No results for input(s): PROBNP in the last 8760 hours. HbA1C: No results for input(s): HGBA1C in the last 72 hours. CBG: No results for input(s): GLUCAP in the last 168 hours. Lipid Profile: No results for input(s): CHOL, HDL, LDLCALC, TRIG, CHOLHDL, LDLDIRECT in the last 72 hours. Thyroid Function Tests: No results for input(s): TSH, T4TOTAL, FREET4, T3FREE, THYROIDAB in the last 72 hours. Anemia Panel: No results for input(s): VITAMINB12, FOLATE, FERRITIN, TIBC, IRON, RETICCTPCT in the last 72 hours. Urine analysis: No results found for: COLORURINE, APPEARANCEUR, LABSPEC, PHURINE, GLUCOSEU, HGBUR, BILIRUBINUR, KETONESUR, PROTEINUR, UROBILINOGEN, NITRITE, LEUKOCYTESUR Sepsis Labs: !!!!!!!!!!!!!!!!!!!!!!!!!!!!!!!!!!!!!!!!!!!! @LABRCNTIP (procalcitonin:4,lacticidven:4) )No results found for this or any previous visit (from the past 240 hour(s)).   Radiological Exams on Admission: Dg Elbow Complete Left  07/28/2015  CLINICAL DATA:  Progressive soft  tissue pain and swelling EXAM: LEFT ELBOW - COMPLETE 3+ VIEW COMPARISON:  July 25, 2015 FINDINGS: Frontal, lateral, and bilateral oblique views were obtained. There is extensive generalized soft tissue swelling throughout the elbow a region as well as involving the distal upper arm and proximal forearm, progressed from most recent prior study. There is no soft tissue air within this generalized soft tissue swelling. There is no acute fracture or dislocation. No joint effusion. There is no appreciable joint space narrowing. No erosive change. Calcification is noted lateral to the lateral distal humeral condyle, likely due to calcific tendinosis. IMPRESSION: Marked soft tissue swelling throughout the distal upper arm and proximal forearm region. This soft tissue swelling shows progression from 3 days prior. No well-defined soft tissue mass or  air apparent. There is no fracture or dislocation. Probable calcific tendinosis laterally. No erosive change. From an imaging standpoint, contrast enhanced MR CT potentially could be helpful to assess for frank abscess in this area. Electronically Signed   By: Bretta Bang III M.D.   On: 07/28/2015 10:52    Assessment/Plan Principal Problem:   Cellulitis of left arm Active Problems:   CORONARY ATHEROSCLEROSIS NATIVE CORONARY ARTERY   Cellulitis of left arm -Patient failed outpatient doxycycline as well as Levaquin by mouth -Currently afebrile, however patient presents with leukocytosis -We'll admit patient to floor and continue patient with Ancef to cover nonpurulent moderate cellulitis  CAD -Stable at present. Patient asymptomatic -Continue home regimen  Leukocytosis -Likely secondary to worsening cellulitis. -Continue patient on antibiotics. Follow trends    DVT prophylaxis: Heparin Code Status: Full Family Communication: pt in room Disposition Plan: Uncertain Consults called: Admission status: inpt   Jahmani Staup, Scheryl Marten MD [facility] Pager [phone]  If 7PM-7AM, please contact night-coverage [email or web address] Password [email or web address]  07/28/2015, 1:26 PM

## 2015-07-28 NOTE — ED Provider Notes (Signed)
CSN: [id number]     Arrival date & time 07/28/15  0901 History   First MD Initiated Contact with Patient 07/28/15 0919     Chief Complaint  Patient presents with  . Arm Swelling     (Consider location/radiation/quality/duration/timing/severity/associated sxs/prior Treatment) HPI   [name] is a 55 y.o. male who presents to the Emergency Department complaining of increasing pain, redness and swelling of the left elbow forearm and hand. Symptoms began on July 7. He was seen by his primary care provider shortly after onset and prescribed doxycycline for 10 days. Symptoms continued to worsen and he was seen at another emergency department 3 days ago was given IV antibiotics and discharged with Levaquin. He reports continued swelling and pain. Pain is worse with palpation and movement. He denies vomiting, fever, chills, numbness of the extremity, and recent injury or wound.   Past Medical History  Diagnosis Date  . CAD (coronary artery disease)     Inferoposterior 8/09 s/p BMS circumflex, LVEF 50%  . Hyperlipidemia   . Hypertension   . Myocardial infarction (HCC)   . Inguinal hernia   . Schizophrenia (HCC)   . Bipolar 1 disorder [facility])    Past Surgical History  Procedure Laterality Date  . Nasal sinus surgery    . Carotid stent    . Colonoscopy     Family History  Problem Relation Age of Onset  . Heart attack Father   . Heart attack Brother    Social History  Substance Use Topics  . Smoking status: Current Every Day Smoker -- 1.00 packs/day    Types: Cigarettes  . Smokeless tobacco: None  . Alcohol Use: Yes     Comment: occasionally    Review of Systems  Constitutional: Negative for fever and chills.  Respiratory: Negative for shortness of breath.   Cardiovascular: Negative for chest pain.  Gastrointestinal: Negative for nausea and vomiting.  Musculoskeletal: Positive for joint swelling and arthralgias (Left elbow and forearm).  Skin: Positive for color change.  Negative for rash and wound.  Neurological: Negative for weakness and numbness.  All other systems reviewed and are negative.     Allergies  Review of patient's allergies indicates no known allergies.  Home Medications   Prior to Admission medications   Medication Sig Start Date End Date Taking? Authorizing Provider  albuterol (PROVENTIL HFA;VENTOLIN HFA) 108 (90 Base) MCG/ACT inhaler Inhale 1 puff into the lungs every 6 (six) hours as needed for wheezing or shortness of breath.   Yes Historical Provider, MD  aspirin 325 MG tablet Take 325 mg by mouth daily.   Yes Historical Provider, MD  aspirin 81 MG tablet Take 81 mg by mouth daily.   Yes Historical Provider, MD  carvedilol (COREG) 25 MG tablet Take 25 mg by mouth 3 (three) times daily.   Yes Historical Provider, MD  doxycycline (MONODOX) 100 MG capsule Take 100 mg by mouth 2 (two) times daily.   Yes Historical Provider, MD  isosorbide mononitrate (IMDUR) 60 MG 24 hr tablet Take 60 mg by mouth daily.   Yes Historical Provider, MD  levofloxacin (LEVAQUIN) 750 MG tablet Take 750 mg by mouth daily.   Yes Historical Provider, MD  lisinopril (PRINIVIL,ZESTRIL) 20 MG tablet Take 20 mg by mouth daily.   Yes Historical Provider, MD  lovastatin (MEVACOR) 20 MG tablet Take 20 mg by mouth at bedtime.   Yes Historical Provider, MD  ondansetron (ZOFRAN) 4 MG tablet Take 4 mg by mouth every 8 (  eight) hours as needed for nausea or vomiting.   Yes Historical Provider, MD  ibuprofen (ADVIL,MOTRIN) 800 MG tablet Take 1 tablet (800 mg total) by mouth 3 (three) times daily. Patient not taking: Reported on 07/28/2015 06/18/14   Glynn Octave, MD   BP 148/78 mmHg  Pulse 102  Temp(Src) 98.3 F (36.8 C) (Oral)  Resp 20  Ht  (1.778 m)  Wt 90.719 kg  BMI 28.70 kg/m2  SpO2 99% Physical Exam  Constitutional: He is oriented to person, place, and time. He appears well-developed and well-nourished. No distress.  HENT:  Head: Normocephalic and  atraumatic.  Cardiovascular: Normal rate, regular rhythm and normal heart sounds.   No murmur heard. Pulmonary/Chest: Effort normal and breath sounds normal. No respiratory distress.  Musculoskeletal: He exhibits edema and tenderness.  Moderate edema, erythema of the left elbow, forearm, and dorsal hand. Pain with range of motion of the elbow. No obvious abscess or wound. Radial pulse brisk, distal sensation intact  Neurological: He is alert and oriented to person, place, and time. He exhibits normal muscle tone. Coordination normal.  Skin: Skin is warm and dry.  Nursing note and vitals reviewed.   ED Course  Procedures (including critical care time) Labs Review Labs Reviewed  CBC WITH DIFFERENTIAL/PLATELET - Abnormal; Notable for the following:    WBC 16.8 (*)    Neutro Abs 13.4 (*)    Monocytes Absolute 1.2 (*)    All other components within normal limits  BASIC METABOLIC PANEL - Abnormal; Notable for the following:    Potassium 3.4 (*)    Chloride 100 (*)    Glucose, Bld 108 (*)    All other components within normal limits    Imaging Review Dg Elbow Complete Left  07/28/2015  CLINICAL DATA:  Progressive soft tissue pain and swelling EXAM: LEFT ELBOW - COMPLETE 3+ VIEW COMPARISON:  July 25, 2015 FINDINGS: Frontal, lateral, and bilateral oblique views were obtained. There is extensive generalized soft tissue swelling throughout the elbow a region as well as involving the distal upper arm and proximal forearm, progressed from most recent prior study. There is no soft tissue air within this generalized soft tissue swelling. There is no acute fracture or dislocation. No joint effusion. There is no appreciable joint space narrowing. No erosive change. Calcification is noted lateral to the lateral distal humeral condyle, likely due to calcific tendinosis. IMPRESSION: Marked soft tissue swelling throughout the distal upper arm and proximal forearm region. This soft tissue swelling shows  progression from 3 days prior. No well-defined soft tissue mass or air apparent. There is no fracture or dislocation. Probable calcific tendinosis laterally. No erosive change. From an imaging standpoint, contrast enhanced MR CT potentially could be helpful to assess for frank abscess in this area. Electronically Signed   By: Bretta Bang III M.D.   On: 07/28/2015 10:52   I have personally reviewed and evaluated these images and lab results as part of my medical decision-making.   EKG Interpretation None      MDM   Final diagnoses:  Cellulitis of left upper extremity    Patient well appearing. Nontoxic. Afebrile.  Cellulitis of left forearm. No obvious abscess at this time although possible developing abscess is considered. Patient failing outpatient doxycycline and Levaquin.   IV vancomycin given here and will consult hospitalist for admission  Patient also seen by Dr. Adriana Simas in care plan discussed.  Consulted Dr. Rhona Leavens who agrees to admit.     Pauline Aus, PA-C  07/28/15 1333  Donnetta HutchingBrian Cook, MD 07/29/15 2158

## 2015-07-29 ENCOUNTER — Inpatient Hospital Stay (HOSPITAL_COMMUNITY): Payer: Medicaid Other

## 2015-07-29 LAB — COMPREHENSIVE METABOLIC PANEL
ALT: 47 U/L (ref 17–63)
AST: 24 U/L (ref 15–41)
Albumin: 3.3 g/dL — ABNORMAL LOW (ref 3.5–5.0)
Alkaline Phosphatase: 93 U/L (ref 38–126)
Anion gap: 6 (ref 5–15)
BILIRUBIN TOTAL: 0.8 mg/dL (ref 0.3–1.2)
BUN: 11 mg/dL (ref 6–20)
CO2: 24 mmol/L (ref 22–32)
CREATININE: 0.93 mg/dL (ref 0.61–1.24)
Calcium: 8.2 mg/dL — ABNORMAL LOW (ref 8.9–10.3)
Chloride: 103 mmol/L (ref 101–111)
Glucose, Bld: 103 mg/dL — ABNORMAL HIGH (ref 65–99)
Potassium: 3.8 mmol/L (ref 3.5–5.1)
Sodium: 133 mmol/L — ABNORMAL LOW (ref 135–145)
TOTAL PROTEIN: 6.6 g/dL (ref 6.5–8.1)

## 2015-07-29 LAB — CBC
HCT: 43.1 % (ref 39.0–52.0)
Hemoglobin: 15.1 g/dL (ref 13.0–17.0)
MCH: 32 pg (ref 26.0–34.0)
MCHC: 35 g/dL (ref 30.0–36.0)
MCV: 91.3 fL (ref 78.0–100.0)
PLATELETS: 323 10*3/uL (ref 150–400)
RBC: 4.72 MIL/uL (ref 4.22–5.81)
RDW: 12.7 % (ref 11.5–15.5)
WBC: 19.1 10*3/uL — AB (ref 4.0–10.5)

## 2015-07-29 MED ORDER — CARVEDILOL 12.5 MG PO TABS
12.5000 mg | ORAL_TABLET | Freq: Two times a day (BID) | ORAL | Status: DC
  Administered 2015-07-29 – 2015-07-31 (×5): 12.5 mg via ORAL
  Filled 2015-07-29 (×5): qty 1

## 2015-07-29 NOTE — Progress Notes (Signed)
PROGRESS NOTE    Tamera StandsJerry L Ybarra  ZOX:[id number] DOB: 07-Dec-1960 DOA: 07/28/2015 [facility]    Brief Narrative:  55 y.o. male with medical history significant of CAD, hypertension, hyperlipidemia who presents with worsening left arm cellulitis. [facility]. Patient had recently been taking doxycycline as well as Levaquin with continued worsening. Patient subsequently presented to the emergency department for further workup   Assessment & Plan:   Principal Problem:   Cellulitis of left arm Active Problems:   CORONARY ATHEROSCLEROSIS NATIVE CORONARY ARTERY   Cellulitis  Cellulitis of left arm -Patient failed outpatient doxycycline as well as Levaquin by mouth -Currently afebrile, however patient presents with leukocytosis. WBC mildly elevated overnight -Patient continued on ancef for moderate nonpurulent cellulitis -Erythema and swelling improved -ordered soft tissue US to r/u underlying abscess  CAD -Stable at present. Patient asymptomatic -Continue home regimen  Leukocytosis -Likely secondary to worsening cellulitis. -Continue patient on antibiotics. Follow trends   DVT prophylaxis: Heparin subQ Code Status: Full Family Communication: Pt in room Disposition Plan: Possible d/c in 24-48hrs   Consultants:     Procedures:     Antimicrobials:  Anti-infectives    Start     Dose/Rate Route Frequency Ordered Stop   07/28/15 1600  ceFAZolin (ANCEF) IVPB 1 g/50 mL premix     1 g 100 mL/hr over 30 Minutes Intravenous Every 8 hours 07/28/15 1348     07/28/15 1400  cefTRIAXone (ROCEPHIN) 1 g in dextrose 5 % 50 mL IVPB  Status:  Discontinued     1 g 100 mL/hr over 30 Minutes Intravenous Every 24 hours 07/28/15 1348 07/28/15 1348   07/28/15 1030  vancomycin (VANCOCIN) IVPB 1000 mg/200 mL premix     1,000 mg 200 mL/hr over 60 Minutes Intravenous  Once 07/28/15 1021 07/28/15 1158           Subjective: Complains of  continued swelling of elbow, otherwise other redness improving  Objective: Filed Vitals:   07/28/15 1518 07/28/15 2124 07/29/15 0537 07/29/15 1406  BP: 132/76 92/43 91/49  121/62  Pulse: 90 85 85 81  Temp: 98.1 F (36.7 C) 100 F (37.8 C) 97.6 F (36.4 C) 98.6 F (37 C)  TempSrc:  Oral Oral Oral  Resp: 18 20 20 20   Height: 5\' 10"  (1.778 m)     Weight: 90.719 kg (200 lb)     SpO2: 98% 94% 94% 97%    Intake/Output Summary (Last 24 hours) at 07/29/15 1829 Last data filed at 07/29/15 1259  Gross per 24 hour  Intake    340 ml  Output    700 ml  Net   -360 ml   Filed Weights   07/28/15 0914 07/28/15 1518  Weight: 90.719 kg (200 lb) 90.719 kg (200 lb)    Examination:  General exam: Appears calm and comfortable  Respiratory system: Clear to auscultation. Respiratory effort normal. Cardiovascular system: S1 & S2 heard, RRR Gastrointestinal system: Abdomen is nondistended, soft and nontender. No organomegaly or masses felt. Normal bowel sounds heard. Central nervous system: Alert and oriented. No focal neurological deficits. Extremities: Symmetric 5 x 5 power. Skin: Erythematous patch over L arm improving, swelling improving Psychiatry: Judgement and insight appear normal. Mood & affect appropriate.     Data Reviewed: I have personally reviewed following labs and imaging studies  CBC:  Recent Labs Lab 07/28/15 0930 07/29/15 0523  WBC 16.8* 19.1*  NEUTROABS 13.4*  --   HGB 16.2 15.1  HCT 46.4 43.1  MCV 91.5 91.3  PLT 322 323   Basic Metabolic Panel:  Recent Labs Lab 08-27-2015 0930 07/29/15 0523  NA 135 133*  K 3.4* 3.8  CL 100* 103  CO2 26 24  GLUCOSE 108* 103*  BUN 11 11  CREATININE 1.00 0.93  CALCIUM 8.9 8.2*   GFR: Estimated Creatinine Clearance: 101.7 mL/min (by C-G formula based on Cr of 0.93). Liver Function Tests:  Recent Labs Lab 07/29/15 0523  AST 24  ALT 47  ALKPHOS 93  BILITOT 0.8  PROT 6.6  ALBUMIN 3.3*   No results for input(s):  LIPASE, AMYLASE in the last 168 hours. No results for input(s): AMMONIA in the last 168 hours. Coagulation Profile: No results for input(s): INR, PROTIME in the last 168 hours. Cardiac Enzymes: No results for input(s): CKTOTAL, CKMB, CKMBINDEX, TROPONINI in the last 168 hours. BNP (last 3 results) No results for input(s): PROBNP in the last 8760 hours. HbA1C: No results for input(s): HGBA1C in the last 72 hours. CBG: No results for input(s): GLUCAP in the last 168 hours. Lipid Profile: No results for input(s): CHOL, HDL, LDLCALC, TRIG, CHOLHDL, LDLDIRECT in the last 72 hours. Thyroid Function Tests: No results for input(s): TSH, T4TOTAL, FREET4, T3FREE, THYROIDAB in the last 72 hours. Anemia Panel: No results for input(s): VITAMINB12, FOLATE, FERRITIN, TIBC, IRON, RETICCTPCT in the last 72 hours. Sepsis Labs: No results for input(s): PROCALCITON, LATICACIDVEN in the last 168 hours.  Recent Results (from the past 240 hour(s))  Culture, blood (routine x 2)     Status: None (Preliminary result)   Collection Time: 08/27/15  3:00 PM  Result Value Ref Range Status   Specimen Description BLOOD RIGHT ARM EXT ASPECT  Final   Special Requests BOTTLES DRAWN AEROBIC AND ANAEROBIC 10CC EACH  Final   Culture NO GROWTH < 24 HOURS  Final   Report Status PENDING  Incomplete  Culture, blood (routine x 2)     Status: None (Preliminary result)   Collection Time: Aug 27, 2015  3:08 PM  Result Value Ref Range Status   Specimen Description BLOOD RIGHT ARM INT. ASPECT  Final   Special Requests BOTTLES DRAWN AEROBIC AND ANAEROBIC 5CC EACH  Final   Culture NO GROWTH < 24 HOURS  Final   Report Status PENDING  Incomplete         Radiology Studies: Dg Elbow Complete Left  08/27/15  CLINICAL DATA:  Progressive soft tissue pain and swelling EXAM: LEFT ELBOW - COMPLETE 3+ VIEW COMPARISON:  July 25, 2015 FINDINGS: Frontal, lateral, and bilateral oblique views were obtained. There is extensive generalized  soft tissue swelling throughout the elbow a region as well as involving the distal upper arm and proximal forearm, progressed from most recent prior study. There is no soft tissue air within this generalized soft tissue swelling. There is no acute fracture or dislocation. No joint effusion. There is no appreciable joint space narrowing. No erosive change. Calcification is noted lateral to the lateral distal humeral condyle, likely due to calcific tendinosis. IMPRESSION: Marked soft tissue swelling throughout the distal upper arm and proximal forearm region. This soft tissue swelling shows progression from 3 days prior. No well-defined soft tissue mass or air apparent. There is no fracture or dislocation. Probable calcific tendinosis laterally. No erosive change. From an imaging standpoint, contrast enhanced MR CT potentially could be helpful to assess for frank abscess in this area. Electronically Signed   By: Bretta Bang III M.D.   On: 27-Aug-2015  10:52   [place] Extrem Up Left Ltd  07/29/2015  CLINICAL DATA:  Abscess EXAM: ULTRASOUND left UPPER EXTREMITY LIMITED TECHNIQUE: Ultrasound examination of the upper extremity soft tissues was performed in the area of clinical concern. COMPARISON:  None FINDINGS: In the air of concern along the left elbow, there is edematous soft tissue without an abscess. IMPRESSION: 1. Edematous subcutaneous tissues in the region of concern along the left elbow, no abscess seen. Electronically Signed   By: Gaylyn Rong M.D.   On: 07/29/2015 16:42        Scheduled Meds: . aspirin  81 mg Oral Daily  . carvedilol  12.5 mg Oral BID WC  .  ceFAZolin (ANCEF) IV  1 g Intravenous Q8H  . heparin  5,000 Units Subcutaneous Q8H  . pravastatin  20 mg Oral q1800   Continuous Infusions:    LOS: 1 day     CHIU, Scheryl Marten, MD [facility] Pager [phone]  If 7PM-7AM, please contact night-coverage [email or web address] Password [facility] 07/29/2015, 6:29 PM

## 2015-07-30 LAB — CBC
HEMATOCRIT: 45.3 % (ref 39.0–52.0)
HEMOGLOBIN: 15.8 g/dL (ref 13.0–17.0)
MCH: 31.5 pg (ref 26.0–34.0)
MCHC: 34.9 g/dL (ref 30.0–36.0)
MCV: 90.4 fL (ref 78.0–100.0)
Platelets: 335 10*3/uL (ref 150–400)
RBC: 5.01 MIL/uL (ref 4.22–5.81)
RDW: 12.7 % (ref 11.5–15.5)
WBC: 20.1 10*3/uL — ABNORMAL HIGH (ref 4.0–10.5)

## 2015-07-30 MED ORDER — BACITRACIN ZINC 500 UNIT/GM EX OINT
TOPICAL_OINTMENT | Freq: Every day | CUTANEOUS | Status: DC
  Administered 2015-07-30 – 2015-07-31 (×2): 1 via TOPICAL
  Filled 2015-07-30 (×2): qty 0.9

## 2015-07-30 NOTE — Care Management Note (Signed)
Case Management Note  Patient Details  Name: [name] MRN: [id number] Date of Birth: Jul 20, 1960  Subjective/Objective:   Patient adm with cellulitis. Failed OP treatment. Has medicaid and [facility] List given of providers accepting patients.                  Action/Plan:  DC home with self care.  Expected Discharge Date:  07/30/15               Expected Discharge Plan:  Home/Self Care  In-House Referral:  NA  Discharge planning Services  CM Consult  Post Acute Care Choice:  NA Choice offered to:  NA  DME Arranged:    DME Agency:     HH Arranged:    HH Agency:     Status of Service:  Completed, signed off  If discussed at [employer] Length of Stay Meetings, dates discussed:    Additional Comments:  Louis Ivery, Chrystine OilerSharley Diane, RN 07/30/2015, 2:49 PM

## 2015-07-30 NOTE — Progress Notes (Signed)
PROGRESS NOTE    Andrew StandsJerry L Cooley  ZOX:[id number] DOB: [birth year] DOA: 07/28/2015 [facility]    Brief Narrative:  55 y.o. male with medical history significant of CAD, hypertension, hyperlipidemia who presents with worsening left arm cellulitis. Patient was recently seen at [facility]. Patient had recently been taking doxycycline as well as Levaquin with continued worsening. Patient subsequently presented to the emergency department for further workup   Assessment & Plan:   Principal Problem:   Cellulitis of left arm Active Problems:   CORONARY ATHEROSCLEROSIS NATIVE CORONARY ARTERY   Cellulitis  Cellulitis of left arm -Patient failed outpatient doxycycline as well as Levaquin by mouth -Currently afebrile, however patient presents with leukocytosis. WBC mildly elevated overnight -Patient continued on ancef for moderate nonpurulent cellulitis -Erythema and swelling continues to improve, despite rising WBC -soft tissue US to neg for underlying abscess  CAD -Stable at present. Patient asymptomatic -Continue home regimen  Leukocytosis -Likely secondary to worsening cellulitis. -Continue patient on antibiotics. Follow trends   DVT prophylaxis: Heparin subQ Code Status: Full Family Communication: Pt in room Disposition Plan: Possible d/c in 24-48hrs   Consultants:     Procedures:     Antimicrobials:  Anti-infectives    Start     Dose/Rate Route Frequency Ordered Stop   07/28/15 1600  ceFAZolin (ANCEF) IVPB 1 g/50 mL premix     1 g 100 mL/hr over 30 Minutes Intravenous Every 8 hours 07/28/15 1348     07/28/15 1400  cefTRIAXone (ROCEPHIN) 1 g in dextrose 5 % 50 mL IVPB  Status:  Discontinued     1 g 100 mL/hr over 30 Minutes Intravenous Every 24 hours 07/28/15 1348 07/28/15 1348   07/28/15 1030  vancomycin (VANCOCIN) IVPB 1000 mg/200 mL premix     1,000 mg 200 mL/hr over 60 Minutes Intravenous  Once 07/28/15 1021 07/28/15 1158           Subjective: States swelling, pain, swelling has improved since admission  Objective: Filed Vitals:   07/29/15 1406 07/29/15 2206 07/30/15 0623 07/30/15 1300  BP: 121/62 128/62 117/60 116/70  Pulse: 81 78 84 74  Temp: 98.6 F (37 C) 99.2 F (37.3 C) 98.9 F (37.2 C) 99.5 F (37.5 C)  TempSrc: Oral Oral Oral Oral  Resp: 20 20 20 20   Height:      Weight:      SpO2: 97% 97% 96% 97%    Intake/Output Summary (Last 24 hours) at 07/30/15 1817 Last data filed at 07/30/15 1200  Gross per 24 hour  Intake    480 ml  Output   1300 ml  Net   -820 ml   Filed Weights   07/28/15 0914 07/28/15 1518  Weight: 90.719 kg (200 lb) 90.719 kg (200 lb)    Examination:  General exam: Appears calm and comfortable, sitting in chair  Respiratory system: Clear to auscultation. Respiratory effort normal. Cardiovascular system: S1 & S2 heard, RRR Gastrointestinal system: Abdomen is nondistended, soft and nontender. No organomegaly or masses felt. Normal bowel sounds heard. Central nervous system: Alert and oriented. No focal neurological deficits. Extremities: Symmetric 5 x 5 power. Skin: Erythematous patch over L arm improving, swelling improving Psychiatry: Judgement and insight appear normal. Mood & affect appropriate.     Data Reviewed: I have personally reviewed following labs and imaging studies  CBC:  Recent Labs Lab 07/28/15 0930 07/29/15 0523 07/30/15 0547  WBC 16.8* 19.1* 20.1*  NEUTROABS 13.4*  --   --  HGB 16.2 15.1 15.8  HCT 46.4 43.1 45.3  MCV 91.5 91.3 90.4  PLT 322 323 335   Basic Metabolic Panel:  Recent Labs Lab 07/28/15 0930 07/29/15 0523  NA 135 133*  K 3.4* 3.8  CL 100* 103  CO2 26 24  GLUCOSE 108* 103*  BUN 11 11  CREATININE 1.00 0.93  CALCIUM 8.9 8.2*   GFR: Estimated Creatinine Clearance: 101.7 mL/min (by C-G formula based on Cr of 0.93). Liver Function Tests:  Recent Labs Lab 07/29/15 0523  AST 24  ALT 47  ALKPHOS 93  BILITOT 0.8   PROT 6.6  ALBUMIN 3.3*   No results for input(s): LIPASE, AMYLASE in the last 168 hours. No results for input(s): AMMONIA in the last 168 hours. Coagulation Profile: No results for input(s): INR, PROTIME in the last 168 hours. Cardiac Enzymes: No results for input(s): CKTOTAL, CKMB, CKMBINDEX, TROPONINI in the last 168 hours. BNP (last 3 results) No results for input(s): PROBNP in the last 8760 hours. HbA1C: No results for input(s): HGBA1C in the last 72 hours. CBG: No results for input(s): GLUCAP in the last 168 hours. Lipid Profile: No results for input(s): CHOL, HDL, LDLCALC, TRIG, CHOLHDL, LDLDIRECT in the last 72 hours. Thyroid Function Tests: No results for input(s): TSH, T4TOTAL, FREET4, T3FREE, THYROIDAB in the last 72 hours. Anemia Panel: No results for input(s): VITAMINB12, FOLATE, FERRITIN, TIBC, IRON, RETICCTPCT in the last 72 hours. Sepsis Labs: No results for input(s): PROCALCITON, LATICACIDVEN in the last 168 hours.  Recent Results (from the past 240 hour(s))  Culture, blood (routine x 2)     Status: None (Preliminary result)   Collection Time: 07/28/15  3:00 PM  Result Value Ref Range Status   Specimen Description BLOOD RIGHT ARM EXT ASPECT  Final   Special Requests BOTTLES DRAWN AEROBIC AND ANAEROBIC 10CC EACH  Final   Culture NO GROWTH < 24 HOURS  Final   Report Status PENDING  Incomplete  Culture, blood (routine x 2)     Status: None (Preliminary result)   Collection Time: 07/28/15  3:08 PM  Result Value Ref Range Status   Specimen Description BLOOD RIGHT ARM INT. ASPECT  Final   Special Requests BOTTLES DRAWN AEROBIC AND ANAEROBIC 5CC EACH  Final   Culture NO GROWTH < 24 HOURS  Final   Report Status PENDING  Incomplete         Radiology Studies: [place] Extrem Up Left Ltd  07/29/2015  CLINICAL DATA:  Abscess EXAM: ULTRASOUND left UPPER EXTREMITY LIMITED TECHNIQUE: Ultrasound examination of the upper extremity soft tissues was performed in the area of  clinical concern. COMPARISON:  None FINDINGS: In the air of concern along the left elbow, there is edematous soft tissue without an abscess. IMPRESSION: 1. Edematous subcutaneous tissues in the region of concern along the left elbow, no abscess seen. Electronically Signed   By: Gaylyn Rong M.D.   On: 07/29/2015 16:42        Scheduled Meds: . aspirin  81 mg Oral Daily  . bacitracin   Topical Daily  . carvedilol  12.5 mg Oral BID WC  .  ceFAZolin (ANCEF) IV  1 g Intravenous Q8H  . heparin  5,000 Units Subcutaneous Q8H  . pravastatin  20 mg Oral q1800   Continuous Infusions:    LOS: 2 days     Alizay Bronkema, Scheryl Marten, MD [facility] Pager [phone]  If 7PM-7AM, please contact night-coverage [email or web address] Password [facility] 07/30/2015, 6:17 PM

## 2015-07-31 LAB — CBC
HEMATOCRIT: 45.8 % (ref 39.0–52.0)
Hemoglobin: 15.4 g/dL (ref 13.0–17.0)
MCH: 30.8 pg (ref 26.0–34.0)
MCHC: 33.6 g/dL (ref 30.0–36.0)
MCV: 91.6 fL (ref 78.0–100.0)
PLATELETS: 390 10*3/uL (ref 150–400)
RBC: 5 MIL/uL (ref 4.22–5.81)
RDW: 12.8 % (ref 11.5–15.5)
WBC: 18.7 10*3/uL — ABNORMAL HIGH (ref 4.0–10.5)

## 2015-07-31 MED ORDER — CEFUROXIME AXETIL 500 MG PO TABS: 500.0000 mg | ORAL_TABLET | Freq: Two times a day (BID) | ORAL | Status: DC

## 2015-07-31 NOTE — Progress Notes (Signed)
Patient with orders to be discharge home. Discharge instructions given, patient verbalized understanding. Prescriptions given. Patient stable. Patient left in private vehicle.  

## 2015-07-31 NOTE — Discharge Summary (Signed)
Physician Discharge Summary  [name] AST:[id number] DOB: [birth year] DOA: 08-14-15  PCP: [facility]  Admit date: 08-14-2015 Discharge date: 07/31/2015  Admitted From: Home Disposition:  Home  Recommendations for Outpatient Follow-up:  1. Follow up with PCP in 1-2 weeks 2. Please obtain BMP/CBC in one week 3. Please follow up on the following pending results:  Discharge Condition: Improved CODE STATUS: Full code Diet recommendation: Heart healthy  Brief/Interim Summary: 55 y.o. male with medical history significant of CAD, hypertension, hyperlipidemia who presents with worsening left arm cellulitis. Patient was recently seen at [facility]. Patient had recently been taking doxycycline as well as Levaquin with continued worsening. Patient subsequently presented to the emergency department for further workup  Cellulitis of left arm -Patient failed outpatient doxycycline as well as Levaquin by mouth -Patient remained afebrile, however patient presents with leukocytosis. -Patient was continued on ancef for moderate nonpurulent cellulitis. White blood cell initially trended up however by day of discharge trended down -Erythema and swelling continues to improve -soft tissue US was negative for underlying abscess  CAD -Patient remained stable at present. Patient asymptomatic -Continued home regimen  Leukocytosis -Likely secondary to worsening cellulitis. -Patient was continued on above antibiotics. -White blood count initially trended up, however trended down by day of discharge.  Discharge Diagnoses:  Principal Problem:   Cellulitis of left arm Active Problems:   CORONARY ATHEROSCLEROSIS NATIVE CORONARY ARTERY   Cellulitis     Medication List    STOP taking these medications        doxycycline 100 MG capsule  Commonly known as:  MONODOX     ibuprofen 800 MG tablet  Commonly known as:  ADVIL,MOTRIN     levofloxacin 750 MG tablet  Commonly known  as:  LEVAQUIN     ondansetron 4 MG tablet  Commonly known as:  ZOFRAN      TAKE these medications        albuterol 108 (90 Base) MCG/ACT inhaler  Commonly known as:  PROVENTIL HFA;VENTOLIN HFA  Inhale 1 puff into the lungs every 6 (six) hours as needed for wheezing or shortness of breath.     aspirin 81 MG tablet  Take 81 mg by mouth daily.     carvedilol 25 MG tablet  Commonly known as:  COREG  Take 25 mg by mouth 3 (three) times daily.     cefUROXime 500 MG tablet  Commonly known as:  CEFTIN  Take 1 tablet (500 mg total) by mouth 2 (two) times daily with a meal.     isosorbide mononitrate 60 MG 24 hr tablet  Commonly known as:  IMDUR  Take 60 mg by mouth daily.     lisinopril 20 MG tablet  Commonly known as:  PRINIVIL,ZESTRIL  Take 20 mg by mouth daily.     lovastatin 20 MG tablet  Commonly known as:  MEVACOR  Take 20 mg by mouth at bedtime.           Follow-up Information    [facility] In 2 weeks.   Why:  Hospital follow up   Contact information:   [address] [place] [address] [phone]      No Known Allergies  Consultations:     Procedures/Studies: Dg Elbow Complete Left  14-Aug-2015  CLINICAL DATA:  Progressive soft tissue pain and swelling EXAM: LEFT ELBOW - COMPLETE 3+ VIEW COMPARISON:  July 25, 2015 FINDINGS: Frontal, lateral, and bilateral oblique views were  obtained. There is extensive generalized soft tissue swelling throughout the elbow a region as well as involving the distal upper arm and proximal forearm, progressed from most recent prior study. There is no soft tissue air within this generalized soft tissue swelling. There is no acute fracture or dislocation. No joint effusion. There is no appreciable joint space narrowing. No erosive change. Calcification is noted lateral to the lateral distal humeral condyle, likely due to calcific tendinosis. IMPRESSION: Marked soft tissue swelling throughout the distal  upper arm and proximal forearm region. This soft tissue swelling shows progression from 3 days prior. No well-defined soft tissue mass or air apparent. There is no fracture or dislocation. Probable calcific tendinosis laterally. No erosive change. From an imaging standpoint, contrast enhanced MR CT potentially could be helpful to assess for frank abscess in this area. Electronically Signed   By: Bretta Bang III M.D.   On: 07/28/2015 10:52   [place] Extrem Up Left Ltd  07/29/2015  CLINICAL DATA:  Abscess EXAM: ULTRASOUND left UPPER EXTREMITY LIMITED TECHNIQUE: Ultrasound examination of the upper extremity soft tissues was performed in the area of clinical concern. COMPARISON:  None FINDINGS: In the air of concern along the left elbow, there is edematous soft tissue without an abscess. IMPRESSION: 1. Edematous subcutaneous tissues in the region of concern along the left elbow, no abscess seen. Electronically Signed   By: Gaylyn Rong M.D.   On: 07/29/2015 16:42     Subjective: Patient feels much better today. He is very eager to go home.  Discharge Exam: Filed Vitals:   07/30/15 2215 07/31/15 0449  BP: 118/59 115/73  Pulse: 92 85  Temp: 98.3 F (36.8 C) 98.7 F (37.1 C)  Resp: 20 20   Filed Vitals:   07/30/15 1300 07/30/15 2100 07/30/15 2215 07/31/15 0449  BP: 116/70  118/59 115/73  Pulse: 74  92 85  Temp: 99.5 F (37.5 C)  98.3 F (36.8 C) 98.7 F (37.1 C)  TempSrc: Oral  Oral Oral  Resp: Height:      Weight:      SpO2: 97% 95% 96% 98%    General: Pt is alert, awake, not in acute distress Cardiovascular: RRR, S1/S2 +, no rubs, no gallops Respiratory: CTA bilaterally, no wheezing, no rhonchi Abdominal: Soft, NT, ND, bowel sounds + Extremities: no edema, no cyanosis , left arm erythema has markedly improved with reduction in edema and tenderness    The results of significant diagnostics from this hospitalization (including imaging, microbiology, ancillary and  laboratory) are listed below for reference.     Microbiology: Recent Results (from the past 240 hour(s))  Culture, blood (routine x 2)     Status: None (Preliminary result)   Collection Time: 07/28/15  3:00 PM  Result Value Ref Range Status   Specimen Description BLOOD RIGHT ARM EXT ASPECT  Final   Special Requests BOTTLES DRAWN AEROBIC AND ANAEROBIC 10CC EACH  Final   Culture NO GROWTH 3 DAYS  Final   Report Status PENDING  Incomplete  Culture, blood (routine x 2)     Status: None (Preliminary result)   Collection Time: 07/28/15  3:08 PM  Result Value Ref Range Status   Specimen Description BLOOD RIGHT ARM INT. ASPECT  Final   Special Requests BOTTLES DRAWN AEROBIC AND ANAEROBIC 5CC EACH  Final   Culture NO GROWTH 3 DAYS  Final   Report Status PENDING  Incomplete     Labs: BNP (last 3 results)  No results for input(s): BNP in the last 8760 hours. Basic Metabolic Panel:  Recent Labs Lab 07/28/15 0930 07/29/15 0523  NA 135 133*  K 3.4* 3.8  CL 100* 103  CO2 26 24  GLUCOSE 108* 103*  BUN 11 11  CREATININE 1.00 0.93  CALCIUM 8.9 8.2*   Liver Function Tests:  Recent Labs Lab 07/29/15 0523  AST 24  ALT 47  ALKPHOS 93  BILITOT 0.8  PROT 6.6  ALBUMIN 3.3*   No results for input(s): LIPASE, AMYLASE in the last 168 hours. No results for input(s): AMMONIA in the last 168 hours. CBC:  Recent Labs Lab 07/28/15 0930 07/29/15 0523 07/30/15 0547 07/31/15 0500  WBC 16.8* 19.1* 20.1* 18.7*  NEUTROABS 13.4*  --   --   --   HGB 16.2 15.1 15.8 15.4  HCT 46.4 43.1 45.3 45.8  MCV 91.5 91.3 90.4 91.6  PLT 322 323 335 390   Cardiac Enzymes: No results for input(s): CKTOTAL, CKMB, CKMBINDEX, TROPONINI in the last 168 hours. BNP: Invalid input(s): POCBNP CBG: No results for input(s): GLUCAP in the last 168 hours. D-Dimer No results for input(s): DDIMER in the last 72 hours. Hgb A1c No results for input(s): HGBA1C in the last 72 hours. Lipid Profile No results for  input(s): CHOL, HDL, LDLCALC, TRIG, CHOLHDL, LDLDIRECT in the last 72 hours. Thyroid function studies No results for input(s): TSH, T4TOTAL, T3FREE, THYROIDAB in the last 72 hours.  Invalid input(s): FREET3 Anemia work up No results for input(s): VITAMINB12, FOLATE, FERRITIN, TIBC, IRON, RETICCTPCT in the last 72 hours. Urinalysis No results found for: COLORURINE, APPEARANCEUR, LABSPEC, PHURINE, GLUCOSEU, HGBUR, BILIRUBINUR, KETONESUR, PROTEINUR, UROBILINOGEN, NITRITE, LEUKOCYTESUR Sepsis Labs Invalid input(s): PROCALCITONIN,  WBC,  LACTICIDVEN Microbiology Recent Results (from the past 240 hour(s))  Culture, blood (routine x 2)     Status: None (Preliminary result)   Collection Time: 07/28/15  3:00 PM  Result Value Ref Range Status   Specimen Description BLOOD RIGHT ARM EXT ASPECT  Final   Special Requests BOTTLES DRAWN AEROBIC AND ANAEROBIC 10CC EACH  Final   Culture NO GROWTH 3 DAYS  Final   Report Status PENDING  Incomplete  Culture, blood (routine x 2)     Status: None (Preliminary result)   Collection Time: 07/28/15  3:08 PM  Result Value Ref Range Status   Specimen Description BLOOD RIGHT ARM INT. ASPECT  Final   Special Requests BOTTLES DRAWN AEROBIC AND ANAEROBIC 5CC EACH  Final   Culture NO GROWTH 3 DAYS  Final   Report Status PENDING  Incomplete    Megham Dwyer, Scheryl Marten, MD  [facility] 07/31/2015, 11:38 AM  If 7PM-7AM, please contact night-coverage [email or web address] Password [email or web address]

## 2015-08-02 LAB — CULTURE, BLOOD (ROUTINE X 2)
CULTURE: NO GROWTH
CULTURE: NO GROWTH

## 2015-08-20 ENCOUNTER — Encounter: Payer: Self-pay | Admitting: *Deleted

## 2015-08-23 ENCOUNTER — Ambulatory Visit (INDEPENDENT_AMBULATORY_CARE_PROVIDER_SITE_OTHER): Payer: Medicaid Other | Admitting: Cardiology

## 2015-08-23 ENCOUNTER — Encounter: Payer: Self-pay | Admitting: Cardiology

## 2015-08-23 VITALS — BP 118/78 | HR 54 | Ht 70.0 in | Wt 202.0 lb

## 2015-08-23 DIAGNOSIS — Z720 Tobacco use: Secondary | ICD-10-CM | POA: Diagnosis not present

## 2015-08-23 DIAGNOSIS — E785 Hyperlipidemia, unspecified: Secondary | ICD-10-CM

## 2015-08-23 DIAGNOSIS — I25119 Atherosclerotic heart disease of native coronary artery with unspecified angina pectoris: Secondary | ICD-10-CM

## 2015-08-23 DIAGNOSIS — I10 Essential (primary) hypertension: Secondary | ICD-10-CM

## 2015-08-23 NOTE — Progress Notes (Signed)
Cardiology Office Note  Date: 08/23/2015   ID: [name], DOB [birth year], MRN [id number]  [facility]  Evaluating Cardiologist: Nona DellSamuel Nelton Amsden, MD   Chief Complaint  Patient presents with  . Coronary Artery Disease    History of Present Illness: [name] is a 55 y.o. male referred by Colvin CaroliKaren House NP to reestablish cardiology follow-up. He was last seen in the [place] office back in 2010. Patient states of interval prison stay from 2010-2016, no regular cardiology follow-up with us, also out of medications for at least 6 months until [facility]. He states that at one point during his incarceration he was evaluated at [facility] with what sounds like a stress test and cardiac catheterization (possibly 2013). He recalls being told that he did have blockages but they were not severe enough to require revascularization and was managed medically. I cannot pull up any records at this time as Care Everywhere is unavailable.  Cardiac history is outlined below. 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 There was moderate residual disease in the LAD and diagonal that was managed medically, LVEF 50% at that point.  He reports intermittent angina symptoms characterized as a tightness in his left lower chest and also sometimes mid scapular discomfort. This is not always associated with physical exertion however. He takes a single nitroglycerin with relief. Pattern has not escalated recently by his account. He states that he feels better in general since starting back on his medications which are outlined below. I reviewed his ECG today which shows evidence of old inferolateral infarct and decreased R wave progression.  Records also indicate [facility] July with left arm cellulitis.  He tells me that he is  undergoing some type of disability determination, has been turned down in the past.  He is not on any psychotropic medications at this time with reported history of psychiatric disease as outlined below. I do not have the details. He has not been followed by behavioral health.  Past Medical History:  Diagnosis Date  . Bipolar 1 disorder (HCC)   . CAD (coronary artery disease)    BMS dominant circumflex, LVEF 50%  . Essential hypertension   . Hyperlipidemia   . 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 Inguinal hernia   . Schizophrenia [facility](HCC)     Past Surgical History:  Procedure Laterality Date  . COLONOSCOPY    . NASAL SINUS SURGERY      Current Outpatient Prescriptions  Medication Sig Dispense Refill  . albuterol (PROVENTIL HFA;VENTOLIN HFA) 108 (90 Base) MCG/ACT inhaler Inhale 1 puff into the lungs every 6 (six) hours as needed for wheezing or shortness of breath.    Marland Kitchen. aspirin EC 325 MG tablet Take 325 mg by mouth daily.    . carvedilol (COREG) 25 MG tablet Take 25 mg by mouth 3 (three) times daily.    . isosorbide mononitrate (IMDUR) 60 MG 24 hr tablet Take 60 mg by mouth daily.    Marland Kitchen. lisinopril (PRINIVIL,ZESTRIL) 20 MG tablet Take 20 mg by mouth daily.    Marland Kitchen. lovastatin (MEVACOR) 20 MG tablet Take 20 mg by mouth at bedtime.    . nitroGLYCERIN (NITROSTAT) 0.4 MG SL tablet Place 0.4 mg under the tongue every 5 (five) minutes as needed for chest pain.  No current facility-administered medications for this visit.    Allergies:  Review of patient's allergies indicates no known allergies.   Social History: The patient  reports that he has been smoking Cigarettes.  He has been smoking about 1.00 pack per day. He has never used smokeless tobacco. He reports that he drinks alcohol. He reports that he does not use drugs.   Family History: The patient's family history includes Heart attack in his brother and father.   ROS:  Please see the history of present  illness. Otherwise, complete review of systems is positive for arthritic knee pain and stiffness, lower back pain.  All other systems are reviewed and negative.   Physical Exam: VS:  BP 118/78   Pulse (!) 54   Ht 5\' 10"  (1.778 m)   Wt 202 lb (91.6 kg)   SpO2 97%   BMI 28.98 kg/m , BMI Body mass index is 28.98 kg/m.  Wt Readings from Last 3 Encounters:  08/23/15 202 lb (91.6 kg)  07/28/15 200 lb (90.7 kg)  06/17/14 213 lb (96.6 kg)    General: Overweight male, appears comfortable at rest. HEENT: Conjunctiva and lids normal, oropharynx clear with poor dentition. Neck: Supple, no elevated JVP or carotid bruits, no thyromegaly. Lungs: Scattered rhonchi, no wheezing, nonlabored breathing at rest. Cardiac: Regular rate and rhythm, no S3 or significant systolic murmur, no pericardial rub. Abdomen: Soft, nontender, bowel sounds present, no guarding or rebound. Extremities: No pitting edema, distal pulses 2+. Skin: Warm and dry. Musculoskeletal: No kyphosis. Neuropsychiatric: Alert and oriented x3, affect grossly appropriate.  ECG: Unable to review old tracing.  Recent Labwork: 07/29/2015: ALT 47; AST 24; BUN 11; Creatinine, Ser 0.93; Potassium 3.8; Sodium 133 07/31/2015: Hemoglobin 15.4; Platelets 390   Other Studies Reviewed Today:  Echocardiogram 08/26/2007: SUMMARY - Overall left ventricular systolic function was at the lower    limits of normal. Left ventricular ejection fraction was    estimated to be 50 %. There was akinesis of the basal-mid    inferoposterior wall. - Aortic valve thickness was mildly increased. - There was mild mitral annular calcification.  Chest x-ray 02/12/2014: FINDINGS: Normal heart size, mediastinal contours and pulmonary vascularity.  Lungs clear.  No pleural effusion or pneumothorax.  Osseous structures unremarkable.  IMPRESSION: No acute abnormalities.  Assessment and Plan:  1. 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 He had moderate LAD and diagonal disease at that time, and it sounds like this was also noted by follow-up catheter[facility] during a time when he was incarcerated between 2010 and 2016. Unfortunately, I cannot pull these records electronically, we are requesting further information. He does not report any escalating symptoms, feels somewhat better since resuming his regular medications. I reviewed his ECG and we will continue with observation for now. If symptoms progress, follow-up ischemic testing can be undertaken.  2. Tobacco abuse. Smoking cessation discussed.  3. Essential hypertension, blood pressure is well controlled today.  4. Hyperlipidemia, on Mevacor. [facility].  Current medicines were reviewed with the patient today.   Orders Placed This Encounter  Procedures  . EKG 12-Lead    Disposition: Follow-up with me in 6 months.  Signed, Jonelle SidleSamuel G. Breda Bond, MD, [facility] 08/23/2015 1:55 PM    [facility] [address], [place], [place] [address] Phone: [phone]; Fax: [phone]  [phone]  

## 2015-08-23 NOTE — Patient Instructions (Signed)
Medication Instructions:   Your physician recommends that you continue on your current medications as directed. Please refer to the Current Medication list given to you today.  Labwork: NONE  Testing/Procedures: NONE  Follow-Up:  Your physician recommends that you schedule a follow-up appointment in: 6 months. You will receive a reminder letter in the mail in about 4 months reminding you to call and schedule your appointment. If you don't receive this letter, please contact our office.  Any Other Special Instructions Will Be Listed Below (If Applicable).  Please sign a release of records form today so that we can request your records from [facility].  If you need a refill on your cardiac medications before your next appointment, please call your pharmacy.

## 2016-07-28 ENCOUNTER — Telehealth: Payer: Self-pay

## 2016-07-28 NOTE — Telephone Encounter (Signed)
Dentist office called stating patient was going to be coming in for a cleaning and wanted to know if patient needed to take any antibiotics prior. If so, which antibiotic do you recommend?

## 2016-07-28 NOTE — Telephone Encounter (Signed)
Patient has no cardiac indication for pre-procedure antibiotics prior to cleaning.

## 2016-07-28 NOTE — Telephone Encounter (Signed)
Dentist office made aware via fax

## 2017-03-13 ENCOUNTER — Telehealth: Payer: Self-pay | Admitting: Cardiology

## 2017-03-13 NOTE — Telephone Encounter (Signed)
Numerous attempts to contact patient with recall letters. Unable to reach by telephone. with no success.  Megan SalonSlaughter, Vicky T [[id number]][[id number]] 08/23/2015 1:56 PM New [10]    [System] 11/10/2015 11:04 PM Notification Sent [20]   Megan SalonSlaughter, Vicky T [[id number]][[id number]] 03/07/2016 4:51 PM Notification Sent [20]   Megan SalonSlaughter, Vicky T [[id number]][[id number]] 07/28/2016 9:28 AM Notification Sent [20]   Megan SalonSlaughter, Vicky T [[id number]][[id number]] 09/14/2016 10:41 AM Notification Sent [20]   Megan SalonSlaughter, Vicky T [[id number]][[id number]] 03/13/2017 1:36 PM Notification Sent [20]

## 2017-09-16 IMAGING — DX DG ELBOW COMPLETE 3+V*L*
4 series · 4 of 4 positions shown · non-contrast
Comparison: July 25, 2015

CLINICAL DATA: Progressive soft tissue pain and swelling

EXAM:
LEFT ELBOW - COMPLETE 3+ VIEW

[elbow ap]
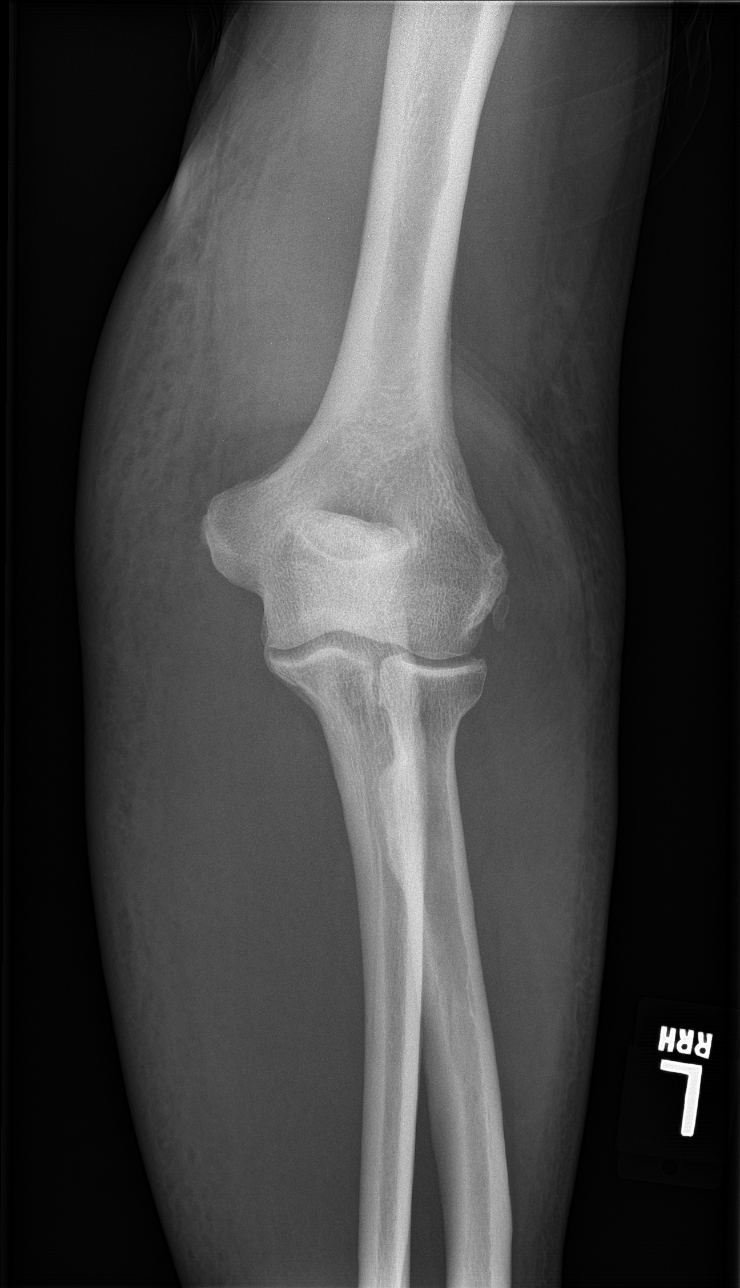

[elbow obl (1 of 2)]
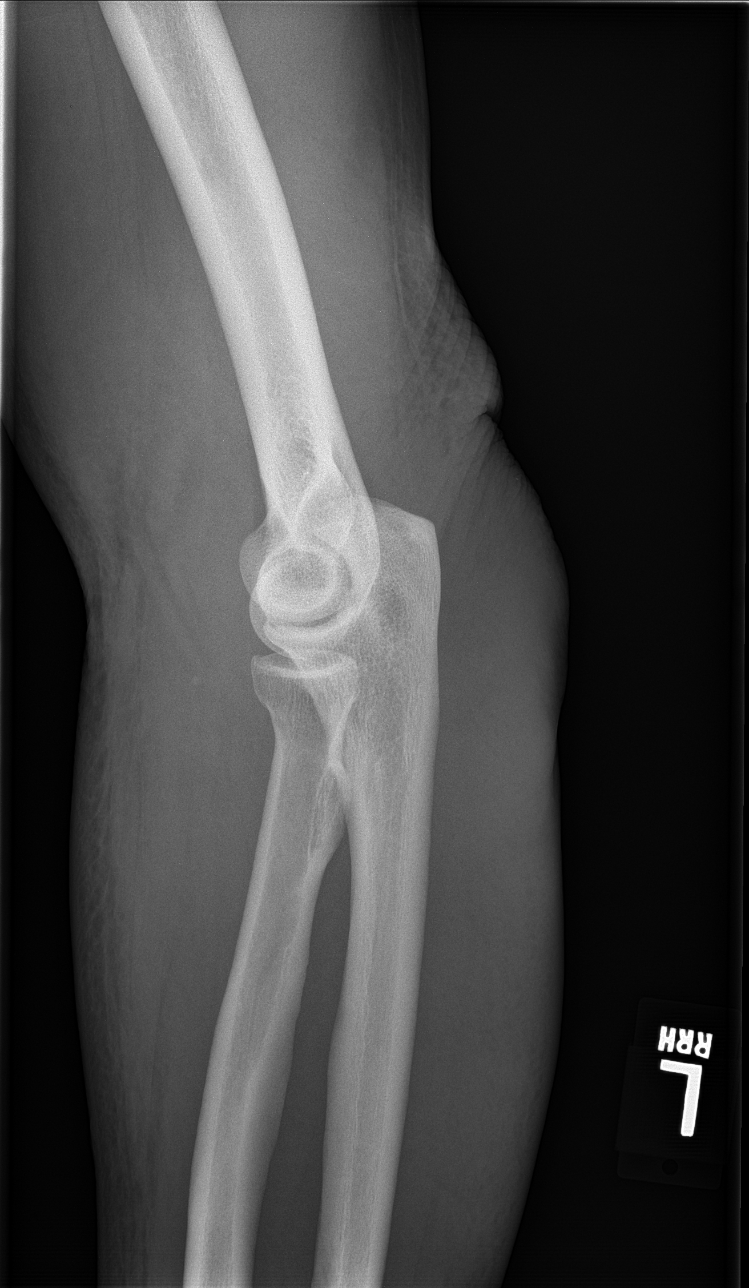

[elbow obl (2 of 2)]
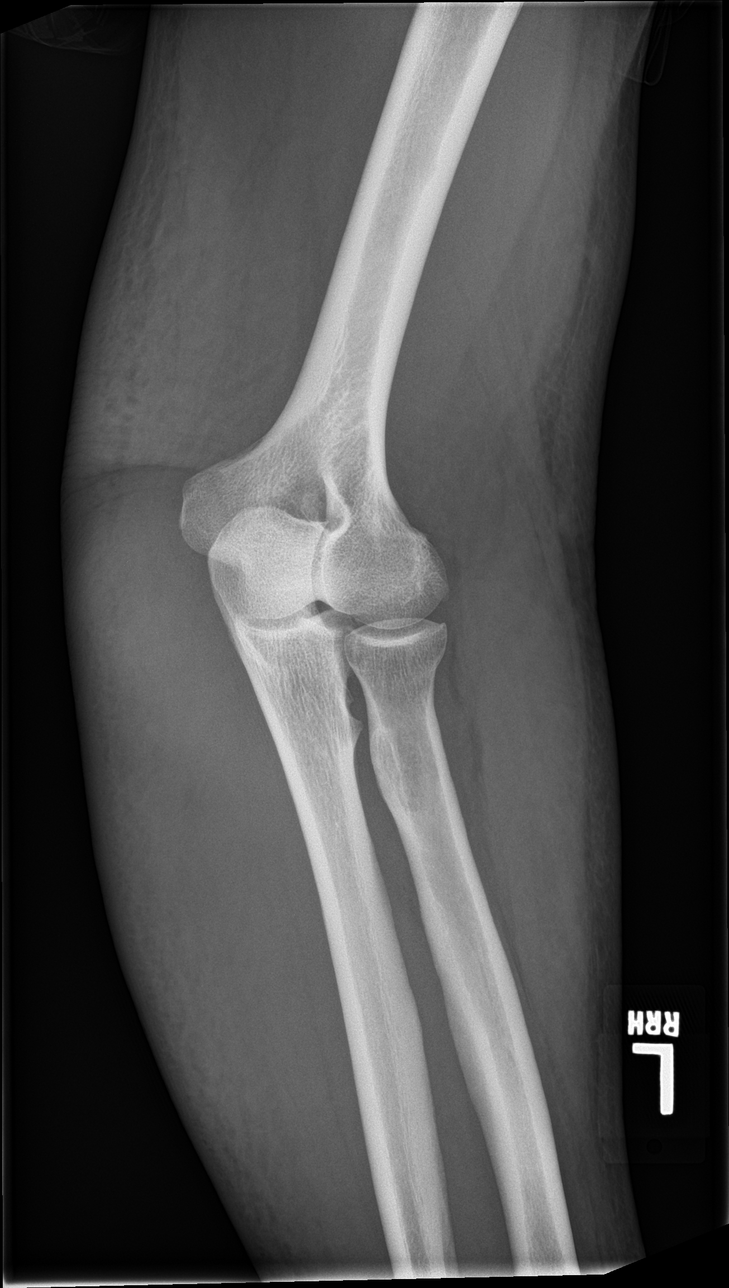

[elbow lat]
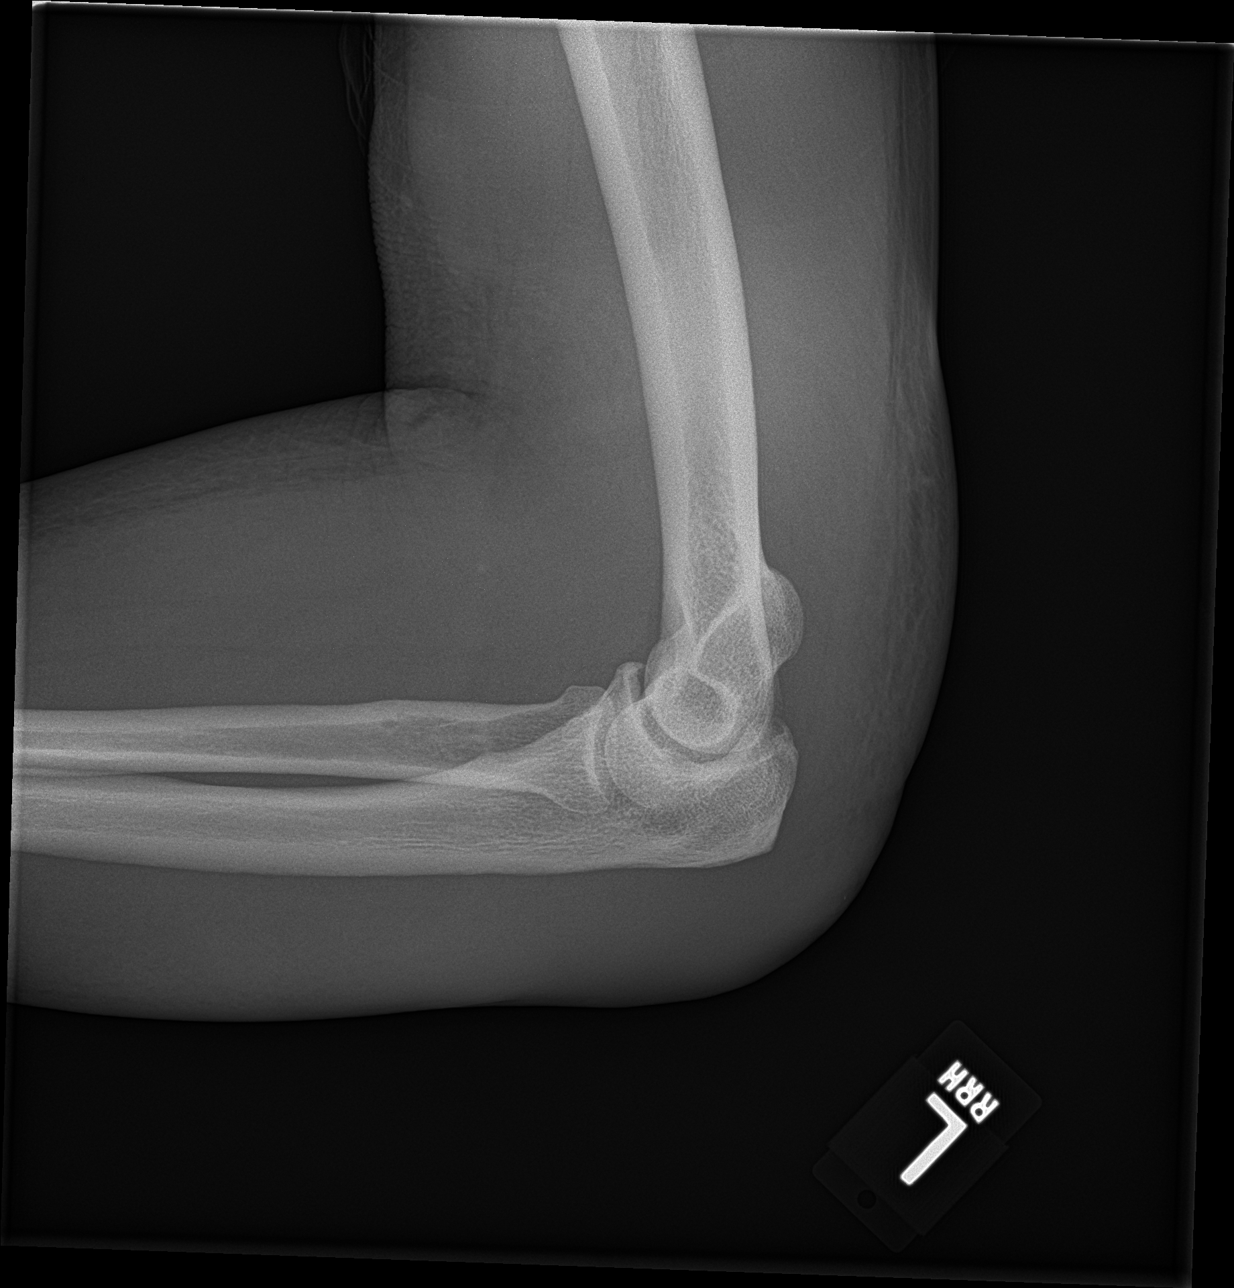

[4 of 4 positions shown; findings below may reference images not displayed]

FINDINGS: Frontal, lateral, and bilateral oblique views were obtained. There
is extensive generalized soft tissue swelling throughout the elbow a
region as well as involving the distal upper arm and proximal
forearm, progressed from most recent prior study. There is no soft
tissue air within this generalized soft tissue swelling. There is no
acute fracture or dislocation. No joint effusion. There is no
appreciable joint space narrowing. No erosive change. Calcification
is noted lateral to the lateral distal humeral condyle, likely due
to calcific tendinosis.
IMPRESSION: Marked soft tissue swelling throughout the distal upper arm and
proximal forearm region. This soft tissue swelling shows progression
from 3 days prior. No well-defined soft tissue mass or air apparent.
There is no fracture or dislocation. Probable calcific tendinosis
laterally. No erosive change.

From an imaging standpoint, contrast enhanced MR CT potentially
could be helpful to assess for frank abscess in this area.

## 2019-01-10 DEATH — deceased
